# Patient Record
Sex: Female | Born: 1960 | Race: White | Hispanic: No | Smoking: Never smoker
Health system: Southern US, Community
[De-identification: ages and names within clinical notes are randomized; demographics above are authoritative.]

## PROBLEM LIST (undated history)

## (undated) DIAGNOSIS — Z860100 Personal history of colon polyps, unspecified: Secondary | ICD-10-CM

## (undated) DIAGNOSIS — K219 Gastro-esophageal reflux disease without esophagitis: Secondary | ICD-10-CM

## (undated) DIAGNOSIS — I10 Essential (primary) hypertension: Secondary | ICD-10-CM

## (undated) DIAGNOSIS — G8929 Other chronic pain: Secondary | ICD-10-CM

## (undated) DIAGNOSIS — R1012 Left upper quadrant pain: Secondary | ICD-10-CM

## (undated) HISTORY — DX: Other chronic pain: G89.29

## (undated) HISTORY — DX: Gastro-esophageal reflux disease without esophagitis: K21.9

## (undated) HISTORY — DX: Personal history of colonic polyps: Z86.010

## (undated) HISTORY — DX: Personal history of colon polyps, unspecified: Z86.0100

## (undated) HISTORY — PX: COLONOSCOPY: SHX174

## (undated) HISTORY — PX: APPENDECTOMY: SHX54

## (undated) HISTORY — DX: Essential (primary) hypertension: I10

## (undated) HISTORY — DX: Left upper quadrant pain: R10.12

---

## 1999-06-24 ENCOUNTER — Encounter: Payer: Self-pay | Admitting: Family Medicine

## 1999-06-24 ENCOUNTER — Encounter: Admission: RE | Admit: 1999-06-24 | Discharge: 1999-06-24 | Payer: Self-pay | Admitting: Family Medicine

## 1999-07-13 ENCOUNTER — Encounter: Payer: Self-pay | Admitting: Family Medicine

## 1999-07-13 ENCOUNTER — Encounter: Admission: RE | Admit: 1999-07-13 | Discharge: 1999-07-13 | Payer: Self-pay | Admitting: Family Medicine

## 2001-01-31 ENCOUNTER — Encounter: Payer: Self-pay | Admitting: Family Medicine

## 2001-01-31 ENCOUNTER — Encounter: Admission: RE | Admit: 2001-01-31 | Discharge: 2001-01-31 | Payer: Self-pay | Admitting: Family Medicine

## 2001-09-10 ENCOUNTER — Other Ambulatory Visit: Admission: RE | Admit: 2001-09-10 | Discharge: 2001-09-10 | Payer: Self-pay | Admitting: Obstetrics and Gynecology

## 2001-12-13 ENCOUNTER — Other Ambulatory Visit: Admission: RE | Admit: 2001-12-13 | Discharge: 2001-12-13 | Payer: Self-pay | Admitting: Obstetrics and Gynecology

## 2002-04-25 ENCOUNTER — Other Ambulatory Visit: Admission: RE | Admit: 2002-04-25 | Discharge: 2002-04-25 | Payer: Self-pay | Admitting: Obstetrics and Gynecology

## 2002-09-25 ENCOUNTER — Encounter: Admission: RE | Admit: 2002-09-25 | Discharge: 2002-09-25 | Payer: Self-pay | Admitting: Family Medicine

## 2002-09-25 ENCOUNTER — Encounter: Payer: Self-pay | Admitting: Family Medicine

## 2002-11-24 ENCOUNTER — Other Ambulatory Visit: Admission: RE | Admit: 2002-11-24 | Discharge: 2002-11-24 | Payer: Self-pay | Admitting: Obstetrics and Gynecology

## 2003-09-10 ENCOUNTER — Other Ambulatory Visit: Admission: RE | Admit: 2003-09-10 | Discharge: 2003-09-10 | Payer: Self-pay | Admitting: Obstetrics and Gynecology

## 2005-05-04 ENCOUNTER — Other Ambulatory Visit: Admission: RE | Admit: 2005-05-04 | Discharge: 2005-05-04 | Payer: Self-pay | Admitting: Obstetrics and Gynecology

## 2005-08-19 ENCOUNTER — Ambulatory Visit: Payer: Self-pay | Admitting: Infectious Diseases

## 2005-08-19 ENCOUNTER — Inpatient Hospital Stay (HOSPITAL_COMMUNITY): Admission: EM | Admit: 2005-08-19 | Discharge: 2005-08-24 | Payer: Self-pay | Admitting: Emergency Medicine

## 2005-09-05 ENCOUNTER — Encounter: Admission: RE | Admit: 2005-09-05 | Discharge: 2005-09-05 | Payer: Self-pay | Admitting: Family Medicine

## 2006-11-09 ENCOUNTER — Emergency Department (HOSPITAL_COMMUNITY): Admission: EM | Admit: 2006-11-09 | Discharge: 2006-11-09 | Payer: Self-pay | Admitting: Emergency Medicine

## 2007-02-27 ENCOUNTER — Encounter: Admission: RE | Admit: 2007-02-27 | Discharge: 2007-02-27 | Payer: Self-pay | Admitting: Family Medicine

## 2007-03-07 ENCOUNTER — Encounter: Admission: RE | Admit: 2007-03-07 | Discharge: 2007-03-07 | Payer: Self-pay | Admitting: Family Medicine

## 2007-08-16 ENCOUNTER — Ambulatory Visit: Payer: Self-pay | Admitting: Internal Medicine

## 2007-09-05 ENCOUNTER — Ambulatory Visit: Payer: Self-pay | Admitting: Internal Medicine

## 2007-09-05 ENCOUNTER — Encounter: Payer: Self-pay | Admitting: Internal Medicine

## 2007-09-05 HISTORY — PX: UPPER GASTROINTESTINAL ENDOSCOPY: SHX188

## 2007-12-04 ENCOUNTER — Other Ambulatory Visit: Admission: RE | Admit: 2007-12-04 | Discharge: 2007-12-04 | Payer: Self-pay | Admitting: Family Medicine

## 2008-09-30 IMAGING — CR DG RIBS 2V*L*
2 series · 2 of 2 positions shown · non-contrast
Comparison: None.

CLINICAL DATA: Fall with left rib pain.

[w ribs ap/pa lower left]
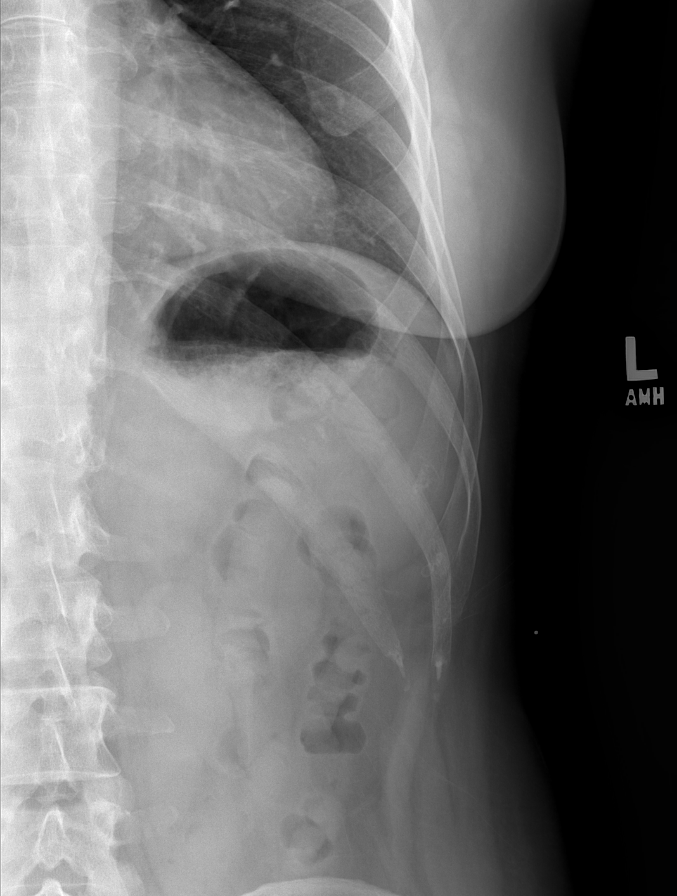

[w ribs oblique left]
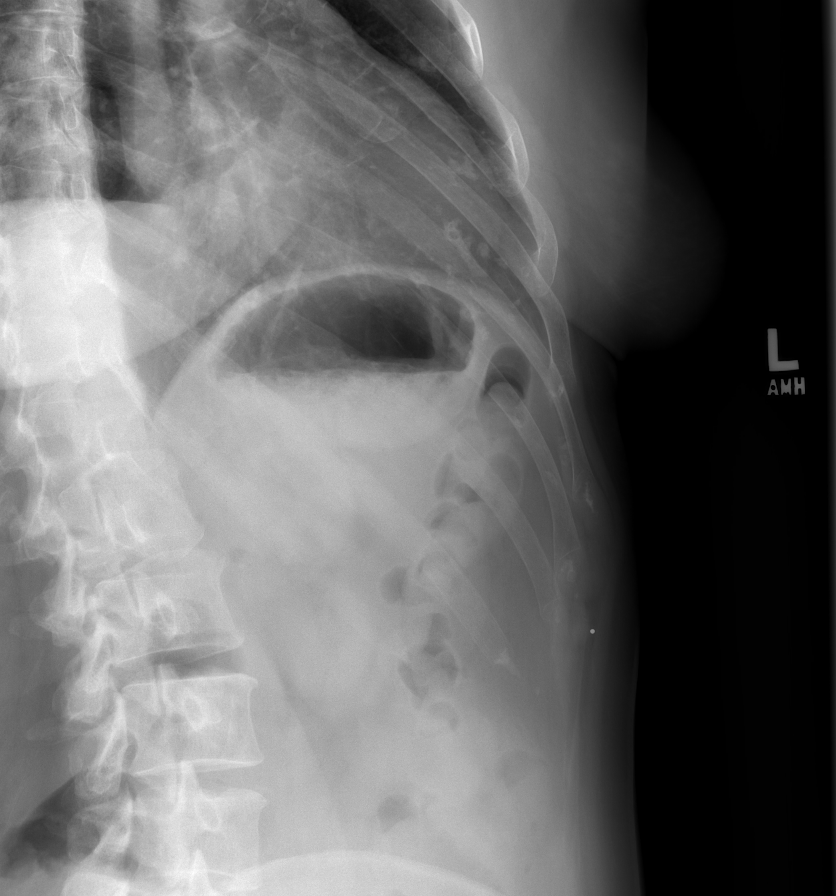

[2 of 2 positions shown; findings below may reference images not displayed]

This study was reviewed in the context of a two-view chest
x-ray performed at the same time.

LEFT RIBS - 2  VIEW:

A radiopaque BB localizes the region of the patient's concern. There is no
underlying lower left rib fracture. No evidence for left pleural effusion. There
is no left pneumothorax.
IMPRESSION: No evidence for left-sided rib fracture.

## 2008-09-30 IMAGING — CR DG CHEST 2V
2 series · 2 of 2 positions shown · non-contrast
Comparison: Chest x-ray from 11/09/2006

CLINICAL DATA: Right lateral rib pain and pain with breathing after a fall.

[w chest pa]
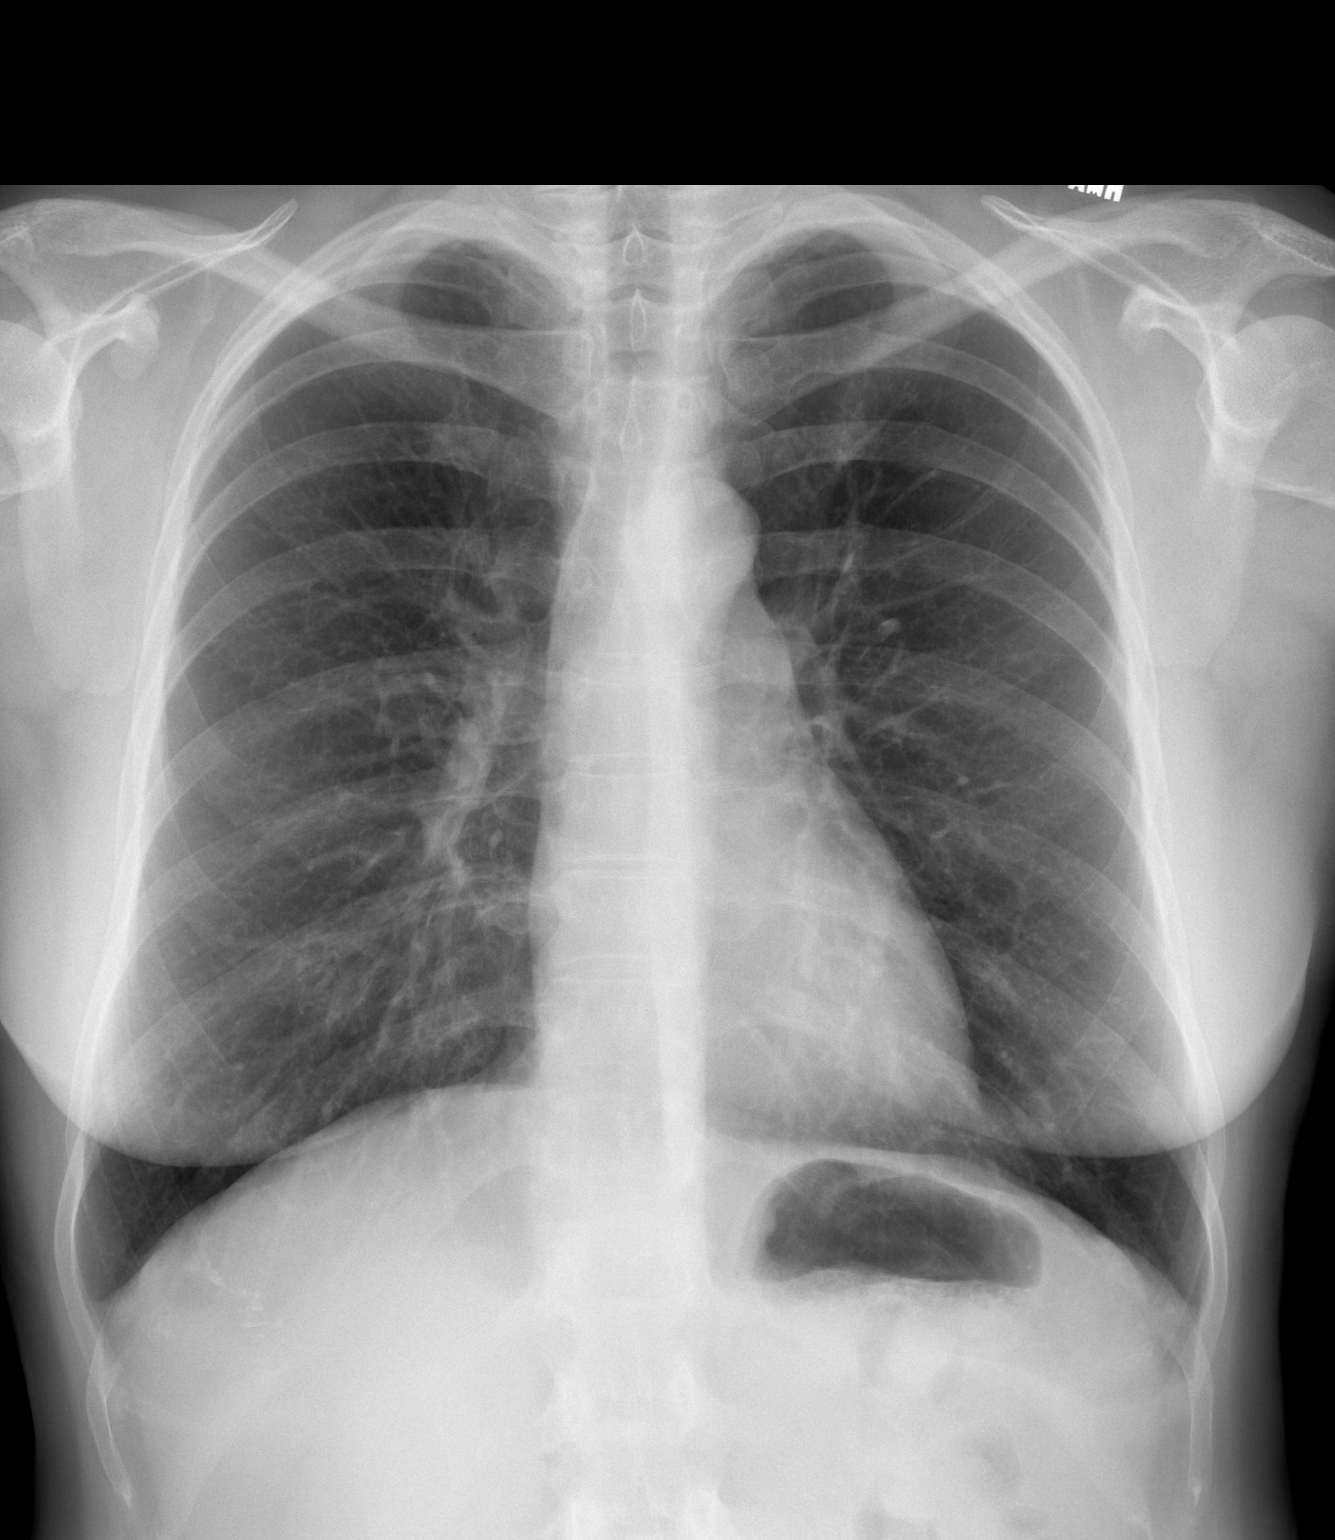

[w chest lat]
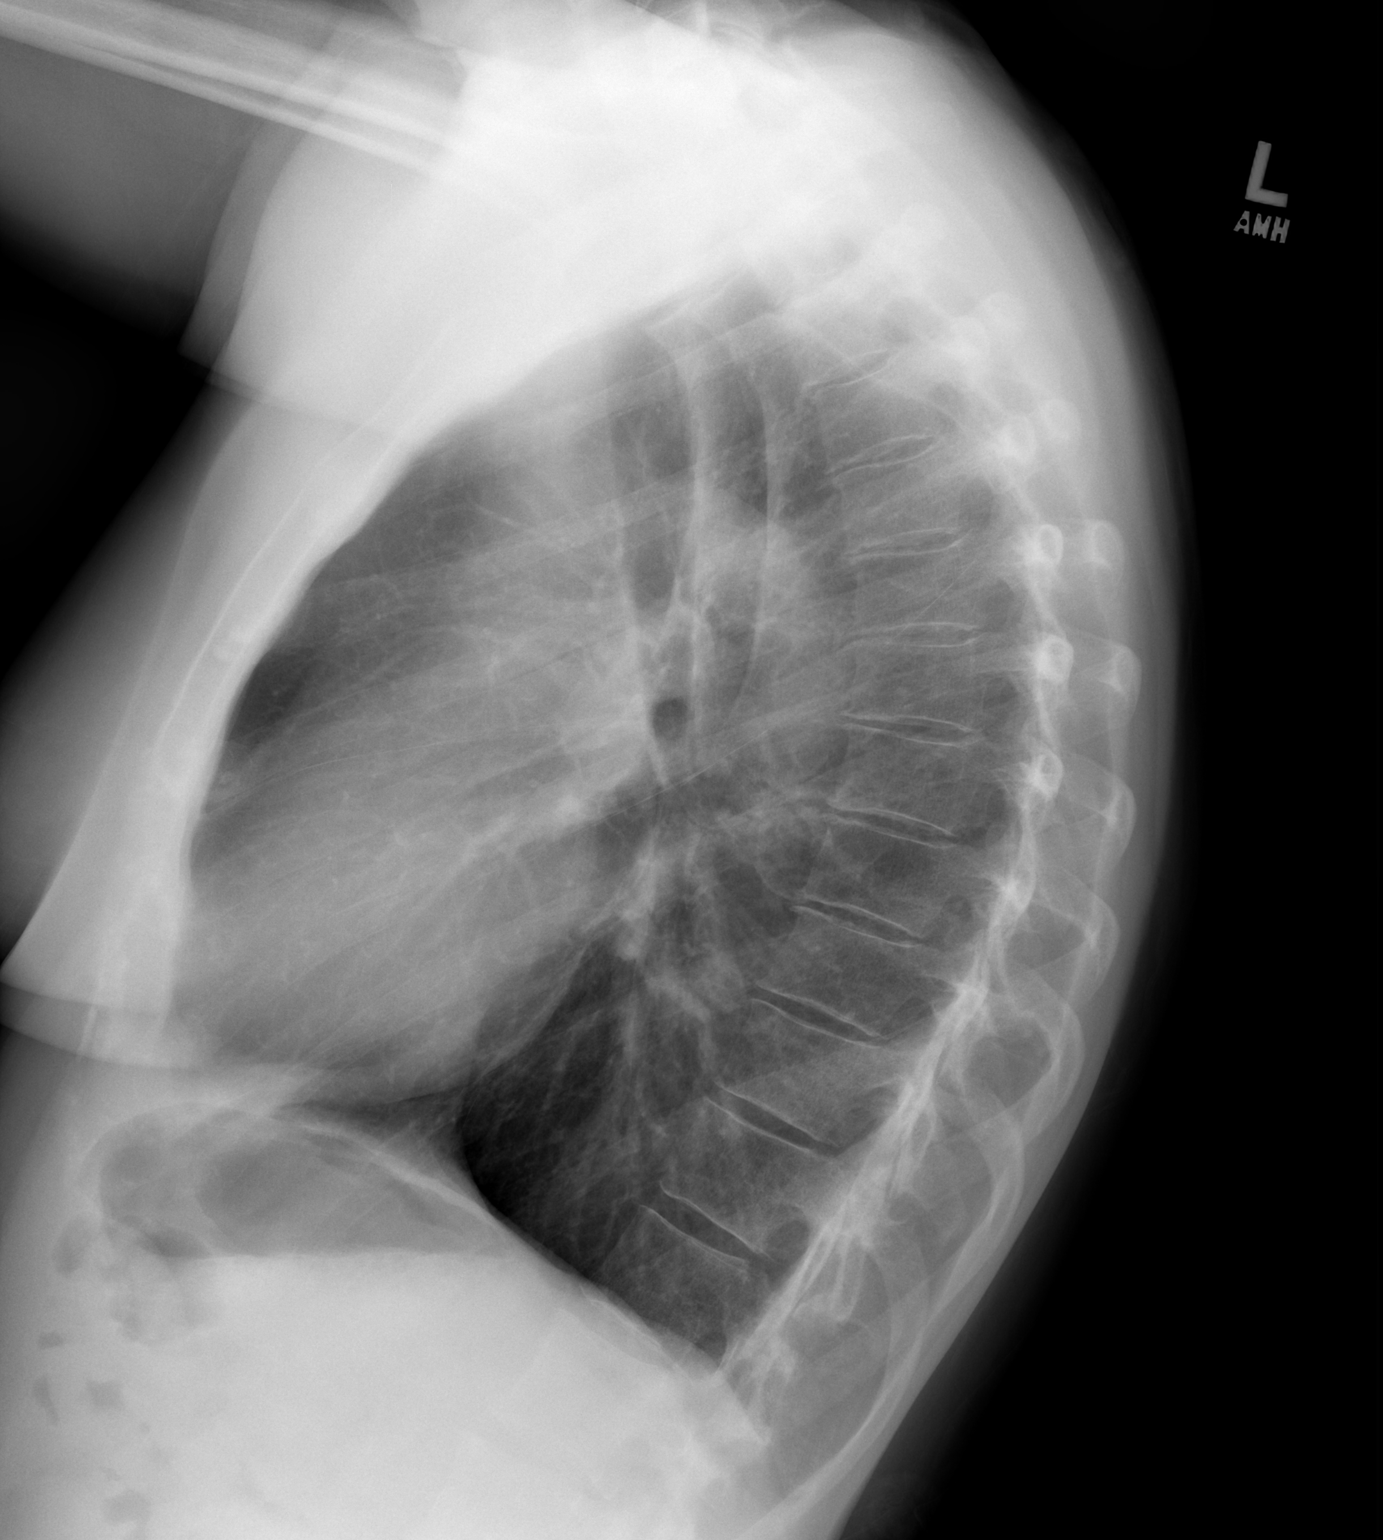

[2 of 2 positions shown; findings below may reference images not displayed]

CHEST - 2 VIEW:

The lungs are clear.  The cardiopericardial silhouette is within normal limits
for size.  Visualized bony structures of the thorax are intact.
IMPRESSION: No acute cardiopulmonary process

## 2010-08-26 ENCOUNTER — Encounter: Payer: Self-pay | Admitting: Internal Medicine

## 2010-09-22 NOTE — Letter (Signed)
Summary: Colonoscopy Letter  [facility]  [address]   [place], [place] [address]   Phone: [phone]  Fax: [phone]      August 26, 2010 MRN: [id number]   Heather Padilla [address] [place], [place]  [address]   Dear Heather Padilla,   According to your medical record, it is time for you to schedule a Colonoscopy. The American Cancer Society recommends this procedure as a method to detect early colon cancer. Patients with a family history of colon cancer, or a personal history of colon polyps or inflammatory bowel disease are at increased risk.  This letter has been generated based on the recommendations made at the time of your procedure. If you feel that in your particular situation this may no longer apply, please contact our office.  Please call our office at [phone] to schedule this appointment or to update your records at your earliest convenience.  Thank you for cooperating with [place] to provide you with the very best care possible.   Sincerely,   Stan Head, M.D.  [facility] [phone]

## 2011-01-03 NOTE — Assessment & Plan Note (Signed)
[facility]                         GASTROENTEROLOGY OFFICE NOTE   DYNASTIE, KNOOP          MRN:          [id number]  DATE:08/16/2007                            DOB:          1960-09-13    CHIEF COMPLAINT:  Nausea.  Heartburn.   HISTORY:  This is a 50 year old white woman who has had a couple month  history of nausea, some vomiting and regurgitation (more regurgitation)  and heartburn described as a pressure pain in her chest.  She also has  indigestion, which is more of a nausea and stomach upset feeling.  She  took a 2-week course of Prilosec, which helped quite a bit, especially  with nocturnal symptoms she was having.  There is also a background of  an intermittent left upper quadrant pain that is fairly sharp, which  comes and goes off and on for a number of years without any obvious  association.  She complains of loose bowel movements after a normal  bowel movement every day for the past 3 to 4 months.  She denies any  significant stressors or changes over that, which is normal.  However,  she recently resolved a dispute between a man she was in a business  with, and she was locked out, and he agreed to settle finally.  She  has lost her appetite as well, though she does not describe a weight  loss.  She is not reporting any bleeding with her bowel movement.  Her  GI review of systems is otherwise negative.   MEDICATIONS:  1. Lisinopril 20 mg daily.  2. Aspirin p.r.n.  3. Ambien p.r.n.  4. Mylanta p.r.n.   DRUG ALLERGIES:  SULFA causes a rash.   PAST MEDICAL HISTORY:  1. Hypertension.  2. Viral meningitis with chronic fatigue afterwards (2006).  3. Prior tubal ligation.  4. Prior Cesarean section.  5. Prior appendectomy.   SOCIAL HISTORY:  She is widowed twice, losing husbands in 1999 and 2004.  She lives with her son.  She has been a IT consultant.  She is currently  unemployed.  She has an associate's degree.  She  used to have a couple  of beers daily.  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  Almost no caffeine is used, because  that bothers her stomach.   REVIEW OF SYSTEMS:  See medical history form for full details.  She has  had a dry cough that has been related to the lisinopril.  She has had a  pityriasis rosea rash on the trunk, which is slowly getting better.  She  has had some itching associated with that.  She has had insomnia.  Eye  glasses or contact lenses are used.  She has had the fatigue.  Last  menstrual period August 01, 2007.  She had a benign skin lesion  removed by Dr. Dorinda Hill in the past year.   All other review of systems negative or as reflected in my medical  history form.   PHYSICAL EXAM:  Height 5 feet 3 inches, weight 130 pounds, blood  pressure 130/88, pulse 80.  HEENT:  Eyes anicteric.  ENT normal mouth and posterior pharynx.  NECK:  No thyromegaly or mass.  CHEST:  Clear.  HEART:  S1, S2.  No murmur, rub, or gallop.  ABDOMEN:  Soft, nontender without organomegaly or mass.  LYMPHATICS:  No neck or supraclavicular nodes.  LOWER EXTREMITIES:  Free of edema.  The skin on the lower extremities  sort of dry and scaly.  I cannot make out a classic tree trunk rash with  the pityriasis rosea on her trunk, but there is some diffuse scaliness  to the skin on the back.  NEURO:  She is alert and oriented x3.  PSYCH:  She appears slightly anxious to me and perhaps even slightly  diffusely tremulous, but mood is otherwise appropriate, as is affect.  RECTAL:  Deferred.   ASSESSMENT:  1. Heartburn and indigestion symptoms with some nausea and vomiting.      Somewhat better.  2. Chronic intermittent left upper quadrant pain.  3. Change in bowel habits, unclear etiology.   PLAN:  1. Sounds like it could very well be a functional syndrome.  Could      have reflux disease.  Given these signs and symptoms, I think an       upper GI endoscopy and a colonoscopy are appropriate to exclude      serious causes, such as ulcer disease, gastrointestinal malignancy,      et Karie Soda.  This has been scheduled for September 05, 2007.  Risks,      benefits, and indications are explained.  2. She has been instructed to reduce her alcohol intake.  3. We will request records from Dr. Manus Gunning regarding labs and office      visits within the last 6 months.  4. Further plans pending the above.  I appreciate the opportunity to      care for this patient.     Iva Boop, MD,FACG  Electronically Signed    CEG/MedQ  DD: 08/16/2007  DT: 08/16/2007  Job #: [id number]   cc:   Bryan Lemma. Manus Gunning, M.D.

## 2011-01-06 NOTE — Discharge Summary (Signed)
Heather Padilla, Heather Padilla   ACCOUNT NO.:  [id number]   MEDICAL RECORD NO.:  [id number]          PATIENT TYPE:  INP   LOCATION:  1614                         FACILITY:  [facility]   PHYSICIAN:  Hollice Espy, M.D.DATE OF BIRTH:  07-Oct-1960   DATE OF ADMISSION:  08/19/2005  DATE OF DISCHARGE:  08/24/2005                                 DISCHARGE SUMMARY   CONSULTATIONS:  Tinnie Gens C. Ninetta Lights, M.D.   PRIMARY CARE PHYSICIAN:  Bryan Lemma. Manus Gunning, M.D.   DISCHARGE DIAGNOSES:  1.  Viral meningitis.  2.  Migraine headache secondary to continued pain medications.  3.  Constipation.   DISCHARGE MEDICATIONS:  1.  Toradol 10 mg 1 p.o. q.6 h p.r.n. total #8.  2.  Percocet 5/325, 1 p.o. q.6 h, total #8.  3.  Ambien 5 mg 1 p.o. q.h.s. p.r.n.   HOSPITAL COURSE:  The patient is a 50 year old white female with essentially  no past medical history who presented on August 19, 2005 complaining of a  severe headache and photophobia. She had previously been treated for UTI  recently and then had been taking Cipro. She presented to the emergency room  with a heart rate of 119 and blood pressure of initially 161/98 and  temperature of 101. Meningitis was suspected and the patient after having a  normal CT scan had a spinal tap done which showed glucose 48, protein 51,  both of these values within normal limits. Tube 1 had 14-30 red cells and 35  white cells, tube 4 had 80 red cells and 33 white cells. Blood cells were  noted to be 100% lymphocyte. ER attending discussed the patient with Dr.  Sandria Manly in the neurology service who advised the patient be admitted for  observation as she may have a partially treated bacterial meningitis. The  patient was started on IV Rocephin. On hospital day 2, the patient was  feeling a little bit better. She started developing some neck stiffness over  the course but it had improved then after several hours with pain  medication. She was noted to have a white count  of 13 with 77% neutrophils.  However in review of lymphocyte predominant spinal tap, she was started on  prophylactic IV acyclovir, HSV titers were sent as well. The patient,  through the course over the next 2 days, had problems with continued pain  and neck stiffness. Her white count continued to improve and by August 22, 2005 was down to 10.0. However, it was noted that she still was spiking  temperatures as high as 101.7. I contacted infectious disease who evaluated  the patient on August 22, 2005. They felt that likely this was meningitis,  perhaps a benign lymphocytic meningitis; however, needed to rule out outside  causes such as herpes simplex or HIV requiring intervention. The patient had  HSV and HIV titers drawn and currently is doing better. The patient also was  complaining in terms of her headache continued pain despite improved neck  stiffness but continued pain with visual aura. I suspect that perhaps she  may be having a rebound migraine which may be clouding the picture secondary  to around the clock  narcotic use. I tapered back the patient's Dilaudid down  to less than q.8 h and added Toradol and Tylenol to this. By August 23, 2005, the patient was starting to feel better and by August 24, 2005, the  patient was feeling back to near her baseline. She said that the only time  she was having a headache was early in the morning but otherwise she had no  more headaches. She had no visual auras. She felt good and was wanting to be  discharged. At the time, her HSV titer was found to be negative as well as  CSF cultures. From an infectious disease standpoint, it was felt that she  could be discharged home though in terms of followup they recommend the  patient followup with her PCP, Dr. Manus Gunning, in the next 1-2 weeks so that  her HIV PCR which is still pending can be followed up on. The patient is  otherwise doing well.   DISPOSITION:  Improved.   ACTIVITY:  As  tolerated.   DIET:  Regular diet.   She is being discharged to home. She will followup with her PCP, Dr.  Manus Gunning, in the next 2 weeks as a followup as well as check HIV titers.      Hollice Espy, M.D.  Electronically Signed     SKK/MEDQ  D:  08/24/2005  T:  08/24/2005  Job:  [id number]   cc:   Lacretia Leigh. Ninetta Lights, M.D.  Fax: [phone]   Bryan Lemma. Manus Gunning, M.D.  Fax: [phone]

## 2011-01-06 NOTE — H&P (Signed)
NAMELYRIC, ROSSANO   ACCOUNT NO.:  [id number]   MEDICAL RECORD NO.:  [id number]          PATIENT TYPE:  EMS   LOCATION:  ED                           FACILITY:  [facility]   PHYSICIAN:  Sherin Quarry, MD      DATE OF BIRTH:  11-24-1960   DATE OF ADMISSION:  08/19/2005  DATE OF DISCHARGE:                                HISTORY & PHYSICAL   Heather Padilla is a 50 year old lady who is generally in good  health. She is a patient of Dr. Jeanmarie Hubert. The patient states that about  10 days prior to this presentation, she presented Dr. Randel Books office. She  says at that time she had a 2 or 3-day history of fever associated with  right-sided abdominal pain. She was evaluated and found to have evidence of  a urinary tract infection for which she was placed on Cipro and instructed  to take this medication on a twice-daily basis for 10 days. She finished the  course of Cipro yesterday. Over the last several days, she has had  persistent dull throbbing headache which seems to be getting more severe.  She has also had persistent fever with temperature to 100-101 degrees. She  says that light bothers her eyes. There is been no rash. She says her neck  is not stiff. She has had no breathing difficulty or chest pain, no nausea,  vomiting or abdominal pain. She says that her bowels have been somewhat  constipated. She has not noticed any dysuria, urinary frequency or nocturia.  She presented to [facility] Room with these complaints. On  presentation to the emergency room, her temperature was 101; blood pressure  was initially 161/98; heart rate was 119, respirations 20; O2 saturation was  98%. The patient had laboratory studies which included a white count of  13,500. A urinalysis was negative. Sodium is 131, potassium 3.4, glucose 92,  creatinine 1.2, BUN 8. Liver profile was normal. Influenza A and B nasal  washings were negative. Dr. Estell Harpin performed a spinal fluid  examination  after doing a CT scan of the brain which was negative. Spinal fluid exam  showed a glucose of 48, protein of 51. Both of these values were within  normal limits. Tube #1 had 14 to 30 red cells and 35 white cells. Tube #4  had 80 red cells and 33 white cells. White cells were also 100% lymphocytes.  Dr. Estell Harpin discussed the case with Dr. Sandria Manly, of the neurology service, who  advised him that the patient should be admitted for observation because of  the possibility that she might have a partially treated bacterial  meningitis. He suggested that a lumbar puncture be repeated in 24 hours.  Therefore, we are admitting Heather Padilla for further evaluation and  observation.   PAST MEDICAL HISTORY:  Medications are none.   ALLERGIES:  She is allergic to SULFA DRUGS.   OPERATION:  She had an appendectomy at age 4 and also had a C-section for  delivery of her children.   MEDICAL ILLNESSES:  None.   FAMILY HISTORY:  Is significant in that her mother and father both died of  complications of  lung cancer. Her brothers and sisters are in good health.   SOCIAL HISTORY:  She reports that she occasionally drinks alcohol. She does  not smoke. She has occasionally smoked marijuana in the past.   PHYSICAL EXAMINATION:  HEENT: Exam is within normal limits. The pupils were  equal and reactive. She has very mild photophobia.  NECK: Very supple. There is no meningismus.  CHEST: Chest is clear.  BACK: Examination the back reveals no CVA or point tenderness.  CARDIOVASCULAR:  Reveals normal sinus rhythm and is without gross murmurs or  gallops.  ABDOMEN: The abdomen is benign with normal bowel sounds without masses or  tenderness.  NEUROLOGIC: On neurologic testing, cranial nerves, motor, sensory and  cerebellar testing is normal. The patient is alert and oriented x3. She is  able to answer detailed questions.  EXTREMITIES: Examination extremities reveals no cyanosis or edema.   SKIN: There is no evidence of any rash.   LABORATORY STUDIES:  As previously noted, included white count of 13,500,  hemoglobin 12.5. The differential shows an increased percentage of  granulocytes. Platelet counts is 54,000. Urinalysis is negative. Sodium 131,  potassium 3.4, glucose 92, creatinine 1.2, BUN 8. Liver profile is normal.  Influenza serologies were negative. Spinal fluid as described above.   IMPRESSION:  1.  Probable viral meningitis. It is possible the patient could have a      partially treated bacterial meningitis, although this seems to be less      likely on the basis of the spinal fluid results.  2.  History of urinary tract infection.   PLAN:  The patient will be given intravenous fluids. We will administer pain  medications as needed. It is somewhat problematic to decide how best to  proceed with her antibiotic therapy.  Appropriately, Dr. Estell Harpin gave her  Rocephin IV before doing the spinal fluid examination. Therefore, since we  are contemplating the possibility that she might have a partially treated  bacterial meningitis, I think it would be best to continue the intravenous  antibiotics. The benefits of this approach would appear to outweigh the  risks. A follow-up spinal fluid examination in 24 hours might be useful.           ______________________________  Sherin Quarry, MD     SY/MEDQ  D:  08/19/2005  T:  08/20/2005  Job:  [id number]   cc:   Bryan Lemma. Manus Gunning, M.D.  Fax: [phone]

## 2011-02-18 ENCOUNTER — Inpatient Hospital Stay (INDEPENDENT_AMBULATORY_CARE_PROVIDER_SITE_OTHER)
Admission: RE | Admit: 2011-02-18 | Discharge: 2011-02-18 | Disposition: A | Discharge status: Home / Self Care | Payer: Self-pay | Source: Ambulatory Visit | Attending: Family Medicine | Admitting: Family Medicine

## 2011-02-18 DIAGNOSIS — I10 Essential (primary) hypertension: Secondary | ICD-10-CM

## 2011-02-18 DIAGNOSIS — T148XXA Other injury of unspecified body region, initial encounter: Secondary | ICD-10-CM

## 2011-03-09 ENCOUNTER — Telehealth: Payer: Self-pay | Admitting: Internal Medicine

## 2011-03-10 ENCOUNTER — Other Ambulatory Visit: Payer: Self-pay | Admitting: *Deleted

## 2011-03-10 ENCOUNTER — Encounter: Payer: Self-pay | Admitting: Internal Medicine

## 2011-03-10 DIAGNOSIS — T8852XA Failed moderate sedation during procedure, initial encounter: Secondary | ICD-10-CM

## 2011-03-10 DIAGNOSIS — Z9283 Personal history of failed moderate sedation: Secondary | ICD-10-CM | POA: Insufficient documentation

## 2011-03-10 NOTE — Telephone Encounter (Signed)
Yes - propofol I added personal hx failed moderate sedation to problem list

## 2011-03-10 NOTE — Telephone Encounter (Signed)
Spoke with patient and scheduled her for colonoscopy with propofol on 03/28/11 arrival at 1:00 for 2:00 PM procedure. Pre visit on 03/21/11 at 3:30 PM.

## 2011-03-10 NOTE — Telephone Encounter (Signed)
Per colonoscopy report on 09/05/2007-consider deep sedation next exam. Dr. Leone Payor, do you want propofol for this patient?

## 2011-03-10 NOTE — Telephone Encounter (Signed)
Left a message for patient to call me back.

## 2011-03-21 ENCOUNTER — Ambulatory Visit (AMBULATORY_SURGERY_CENTER): Payer: BC Managed Care – PPO | Admitting: *Deleted

## 2011-03-21 VITALS — Ht 63.0 in | Wt 131.0 lb

## 2011-03-21 DIAGNOSIS — Z1211 Encounter for screening for malignant neoplasm of colon: Secondary | ICD-10-CM

## 2011-03-21 MED ORDER — PEG-KCL-NACL-NASULF-NA ASC-C 100 G PO SOLR: ORAL | Status: DC

## 2011-03-22 ENCOUNTER — Encounter: Payer: Self-pay | Admitting: Internal Medicine

## 2011-03-28 ENCOUNTER — Encounter: Payer: Self-pay | Admitting: Internal Medicine

## 2011-03-28 ENCOUNTER — Ambulatory Visit (AMBULATORY_SURGERY_CENTER): Payer: BC Managed Care – PPO | Admitting: Internal Medicine

## 2011-03-28 VITALS — BP 164/97 | HR 95 | Temp 98.9°F | Resp 14 | Ht 63.0 in | Wt 131.0 lb

## 2011-03-28 DIAGNOSIS — Z860100 Personal history of colon polyps, unspecified: Secondary | ICD-10-CM | POA: Insufficient documentation

## 2011-03-28 DIAGNOSIS — I10 Essential (primary) hypertension: Secondary | ICD-10-CM | POA: Insufficient documentation

## 2011-03-28 DIAGNOSIS — Z1211 Encounter for screening for malignant neoplasm of colon: Secondary | ICD-10-CM

## 2011-03-28 DIAGNOSIS — K219 Gastro-esophageal reflux disease without esophagitis: Secondary | ICD-10-CM | POA: Insufficient documentation

## 2011-03-28 MED ORDER — SODIUM CHLORIDE 0.9 % IV SOLN: 500.0000 mL | INTRAVENOUS | Status: DC

## 2011-03-29 ENCOUNTER — Telehealth: Payer: Self-pay | Admitting: *Deleted

## 2011-03-29 NOTE — Telephone Encounter (Signed)

## 2011-12-29 ENCOUNTER — Other Ambulatory Visit: Payer: Self-pay | Admitting: Dermatology

## 2012-07-01 ENCOUNTER — Emergency Department (HOSPITAL_COMMUNITY): Payer: BC Managed Care – PPO

## 2012-07-01 ENCOUNTER — Emergency Department (HOSPITAL_COMMUNITY)
Admission: EM | Admit: 2012-07-01 | Discharge: 2012-07-01 | Disposition: A | Discharge status: Home / Self Care | Payer: BC Managed Care – PPO | Attending: Emergency Medicine | Admitting: Emergency Medicine

## 2012-07-01 ENCOUNTER — Encounter (HOSPITAL_COMMUNITY): Payer: Self-pay

## 2012-07-01 DIAGNOSIS — I10 Essential (primary) hypertension: Secondary | ICD-10-CM | POA: Insufficient documentation

## 2012-07-01 DIAGNOSIS — S42309A Unspecified fracture of shaft of humerus, unspecified arm, initial encounter for closed fracture: Secondary | ICD-10-CM | POA: Insufficient documentation

## 2012-07-01 DIAGNOSIS — Z79899 Other long term (current) drug therapy: Secondary | ICD-10-CM | POA: Insufficient documentation

## 2012-07-01 DIAGNOSIS — S42209A Unspecified fracture of upper end of unspecified humerus, initial encounter for closed fracture: Secondary | ICD-10-CM | POA: Insufficient documentation

## 2012-07-01 DIAGNOSIS — Y929 Unspecified place or not applicable: Secondary | ICD-10-CM | POA: Insufficient documentation

## 2012-07-01 DIAGNOSIS — Y9301 Activity, walking, marching and hiking: Secondary | ICD-10-CM | POA: Insufficient documentation

## 2012-07-01 DIAGNOSIS — Z860100 Personal history of colon polyps, unspecified: Secondary | ICD-10-CM | POA: Insufficient documentation

## 2012-07-01 DIAGNOSIS — Z87891 Personal history of nicotine dependence: Secondary | ICD-10-CM | POA: Insufficient documentation

## 2012-07-01 DIAGNOSIS — R1012 Left upper quadrant pain: Secondary | ICD-10-CM | POA: Insufficient documentation

## 2012-07-01 DIAGNOSIS — K219 Gastro-esophageal reflux disease without esophagitis: Secondary | ICD-10-CM | POA: Insufficient documentation

## 2012-07-01 DIAGNOSIS — W1789XA Other fall from one level to another, initial encounter: Secondary | ICD-10-CM | POA: Insufficient documentation

## 2012-07-01 MED ORDER — OXYCODONE-ACETAMINOPHEN 5-325 MG PO TABS
1.0000 | ORAL_TABLET | Freq: Once | ORAL | Status: AC
  Administered 2012-07-01: 1 via ORAL
  Filled 2012-07-01: qty 1

## 2012-07-01 MED ORDER — OXYCODONE-ACETAMINOPHEN 5-325 MG PO TABS: 1.0000 | ORAL_TABLET | Freq: Four times a day (QID) | ORAL | Status: DC | PRN

## 2012-07-01 NOTE — ED Provider Notes (Signed)
Medical screening examination/treatment/procedure(s) were performed by non-physician practitioner and as supervising physician I was immediately available for consultation/collaboration.  Sunnie Nielsen, MD 07/01/12 [phone]

## 2012-07-01 NOTE — ED Provider Notes (Signed)
History     CSN: [id number]  Arrival date & time 07/01/12  0016   First MD Initiated Contact with Patient 07/01/12 0153      Chief Complaint  Patient presents with  . Fall  . Arm Pain    (Consider location/radiation/quality/duration/timing/severity/associated sxs/prior treatment) HPI Comments: Patient fell while trying to get into bed landing on L shoulder/arm now with deformity in L upper extremity.   States her blood pressure medication have been changed and she may have been slightly dizzy form that but is unsure Denies any other injury   Patient is a 51 y.o. female presenting with fall and arm pain. The history is provided by the patient.  Fall The accident occurred 1 to 2 hours ago. The fall occurred while walking. She fell from a height of 3 to 5 ft. The point of impact was the left shoulder. The pain is at a severity of 10/10. The pain is moderate. There was no entrapment after the fall. There was no alcohol use involved in the accident. Pertinent negatives include no fever and no numbness. The symptoms are aggravated by activity.  Arm Pain Pertinent negatives include no chills, fever, joint swelling, neck pain, numbness or weakness.    Past Medical History  Diagnosis Date  . GERD (gastroesophageal reflux disease)   . Hypertension   . Personal history of colonic polyps     2 cm tubulovillous adenoma  . Chronic LUQ pain     intermittent, ? splenic flexure syndrome    Past Surgical History  Procedure Date  . Colonoscopy 09/05/2007; 03/28/2011    2009: 2 cm Tubulovillous adenoma; 2012 Normal  . Cesarean section 1987  . Appendectomy   . Upper gastrointestinal endoscopy 09/05/2007    GERD    No family history on file.  History  Substance Use Topics  . Smoking status: Former Smoker    Quit date: 12/26/1999  . Smokeless tobacco: Never Used  . Alcohol Use: 3.6 oz/week    6 Cans of beer per week    OB History    Grav Para Term Preterm Abortions TAB SAB Ect Mult  Living                  Review of Systems  Constitutional: Negative for fever and chills.  HENT: Negative for neck pain.   Respiratory: Negative.   Cardiovascular: Negative.   Gastrointestinal: Negative.   Genitourinary: Negative.   Musculoskeletal: Negative for back pain and joint swelling.  Neurological: Negative for dizziness, weakness and numbness.    Allergies  Sulfa antibiotics  Home Medications   Current Outpatient Rx  Name  Route  Sig  Dispense  Refill  . ATENOLOL 25 MG PO TABS   Oral   Take 25 mg by mouth daily.         . BUSPIRONE HCL 10 MG PO TABS   Oral   Take 5 mg by mouth 2 (two) times daily.         Marland Kitchen LANSOPRAZOLE 30 MG PO CPDR   Oral   Take 30 mg by mouth daily.           Marland Kitchen LISINOPRIL 40 MG PO TABS   Oral   Take 40 mg by mouth daily.           . OXYCODONE-ACETAMINOPHEN 5-325 MG PO TABS   Oral   Take 1 tablet by mouth every 6 (six) hours as needed for pain.   30 tablet   0  BP 142/101  Pulse 99  Temp 98 F (36.7 C) (Oral)  Resp 16  Ht 5\' 3"  (1.6 m)  Wt 130 lb (58.968 kg)  BMI 23.03 kg/m2  SpO2 100%  Physical Exam  Constitutional: She is oriented to person, place, and time. She appears well-developed and well-nourished.  HENT:  Head: Normocephalic.  Eyes: Pupils are equal, round, and reactive to light.  Neck: Normal range of motion.  Cardiovascular: Normal rate.   Pulmonary/Chest: Effort normal.  Abdominal: Soft. There is no tenderness.  Musculoskeletal: She exhibits tenderness.       Arms: Neurological: She is alert and oriented to person, place, and time.  Skin: Skin is warm and dry. No erythema.    ED Course  Procedures (including critical care time)  Labs Reviewed - No data to display Dg Humerus Left  07/01/2012  *RADIOLOGY REPORT*  Clinical Data: Status post fall  LEFT HUMERUS - 2+ VIEW  Comparison: None.  Findings: There is a comminuted and oblique fracture deformity involving the proximal and mid shaft  humerus. There is medial angulation of the distal fracture fragments.  There is medial displacement of the distal fracture fragments by one full shaft's width.  IMPRESSION:  1.  Comminuted fracture involves the proximal and mid shaft of the left humerus.  There is medial angulation and displacement of the distal fracture fragments.   Original Report Authenticated By: Signa Kell, M.D.      1. Closed fracture of upper arm       MDM  Spoke with DR. Blackmon who agrees with plan of sling, pain control and office follow up         Arman Filter, NP 07/01/12 [phone]

## 2012-07-01 NOTE — ED Notes (Signed)
Pt presents with NAD- attempting to get in bed and fell landing on left side.  Deformity present to the LUE radial pulse present and skin warm to touch. denies neck and back pain

## 2012-07-02 ENCOUNTER — Encounter (HOSPITAL_COMMUNITY): Payer: Self-pay | Admitting: *Deleted

## 2012-07-02 ENCOUNTER — Ambulatory Visit (HOSPITAL_COMMUNITY): Payer: BC Managed Care – PPO

## 2012-07-02 ENCOUNTER — Ambulatory Visit (HOSPITAL_COMMUNITY): Payer: BC Managed Care – PPO | Admitting: *Deleted

## 2012-07-02 ENCOUNTER — Encounter (HOSPITAL_COMMUNITY)
Admission: RE | Disposition: A | Discharge status: Home / Self Care | Payer: Self-pay | Source: Ambulatory Visit | Attending: Orthopaedic Surgery

## 2012-07-02 ENCOUNTER — Other Ambulatory Visit (HOSPITAL_COMMUNITY): Payer: Self-pay | Admitting: Orthopaedic Surgery

## 2012-07-02 ENCOUNTER — Ambulatory Visit (HOSPITAL_COMMUNITY)
Admission: RE | Admit: 2012-07-02 | Discharge: 2012-07-03 | Disposition: A | Discharge status: Home / Self Care | Payer: BC Managed Care – PPO | Source: Ambulatory Visit | Attending: Orthopaedic Surgery | Admitting: Orthopaedic Surgery

## 2012-07-02 ENCOUNTER — Encounter (HOSPITAL_COMMUNITY): Payer: Self-pay | Admitting: Pharmacy Technician

## 2012-07-02 DIAGNOSIS — Y92009 Unspecified place in unspecified non-institutional (private) residence as the place of occurrence of the external cause: Secondary | ICD-10-CM | POA: Insufficient documentation

## 2012-07-02 DIAGNOSIS — I10 Essential (primary) hypertension: Secondary | ICD-10-CM | POA: Insufficient documentation

## 2012-07-02 DIAGNOSIS — K219 Gastro-esophageal reflux disease without esophagitis: Secondary | ICD-10-CM | POA: Insufficient documentation

## 2012-07-02 DIAGNOSIS — F341 Dysthymic disorder: Secondary | ICD-10-CM | POA: Insufficient documentation

## 2012-07-02 DIAGNOSIS — S42302A Unspecified fracture of shaft of humerus, left arm, initial encounter for closed fracture: Secondary | ICD-10-CM

## 2012-07-02 DIAGNOSIS — S42309A Unspecified fracture of shaft of humerus, unspecified arm, initial encounter for closed fracture: Secondary | ICD-10-CM | POA: Insufficient documentation

## 2012-07-02 DIAGNOSIS — W19XXXA Unspecified fall, initial encounter: Secondary | ICD-10-CM | POA: Insufficient documentation

## 2012-07-02 HISTORY — PX: ORIF HUMERUS FRACTURE: SHX2126

## 2012-07-02 LAB — BASIC METABOLIC PANEL
Calcium: 9.9 mg/dL (ref 8.4–10.5)
GFR calc Af Amer: 88 mL/min — ABNORMAL LOW (ref >90)
GFR calc non Af Amer: 76 mL/min — ABNORMAL LOW (ref >90)
Potassium: 4.1 mEq/L (ref 3.5–5.1)
Sodium: 131 mEq/L — ABNORMAL LOW (ref 135–145)

## 2012-07-02 LAB — CBC
Hemoglobin: 11.4 g/dL — ABNORMAL LOW (ref 12.0–15.0)
Platelets: 192 10*3/uL (ref 150–400)
RBC: 3.46 MIL/uL — ABNORMAL LOW (ref 3.87–5.11)
WBC: 10.5 10*3/uL (ref 4.0–10.5)

## 2012-07-02 LAB — SURGICAL PCR SCREEN: Staphylococcus aureus: POSITIVE — AB

## 2012-07-02 SURGERY — OPEN REDUCTION INTERNAL FIXATION (ORIF) HUMERAL SHAFT FRACTURE
Anesthesia: Regional | Site: Arm Upper | Laterality: Left | Wound class: Clean

## 2012-07-02 MED ORDER — OXYCODONE HCL 5 MG PO TABS
5.0000 mg | ORAL_TABLET | ORAL | Status: DC | PRN
  Administered 2012-07-03: 10 mg via ORAL
  Filled 2012-07-02: qty 2

## 2012-07-02 MED ORDER — METHOCARBAMOL 100 MG/ML IJ SOLN: 500.0000 mg | Freq: Four times a day (QID) | INTRAVENOUS | Status: DC | PRN

## 2012-07-02 MED ORDER — METOCLOPRAMIDE HCL 10 MG PO TABS: 5.0000 mg | ORAL_TABLET | Freq: Three times a day (TID) | ORAL | Status: DC | PRN

## 2012-07-02 MED ORDER — LACTATED RINGERS IV SOLN
INTRAVENOUS | Status: DC | PRN
  Administered 2012-07-02 (×3): via INTRAVENOUS

## 2012-07-02 MED ORDER — PROPOFOL 10 MG/ML IV BOLUS
INTRAVENOUS | Status: DC | PRN
  Administered 2012-07-02: 200 mg via INTRAVENOUS

## 2012-07-02 MED ORDER — ATENOLOL 25 MG PO TABS
25.0000 mg | ORAL_TABLET | Freq: Every day | ORAL | Status: DC
  Filled 2012-07-02: qty 1

## 2012-07-02 MED ORDER — HYDROCODONE-ACETAMINOPHEN 5-325 MG PO TABS
1.0000 | ORAL_TABLET | ORAL | Status: DC | PRN
  Administered 2012-07-03: 2 via ORAL
  Filled 2012-07-02 (×2): qty 2

## 2012-07-02 MED ORDER — MIDAZOLAM HCL 2 MG/2ML IJ SOLN
INTRAMUSCULAR | Status: AC
  Filled 2012-07-02: qty 2

## 2012-07-02 MED ORDER — ONDANSETRON HCL 4 MG/2ML IJ SOLN
INTRAMUSCULAR | Status: DC | PRN
  Administered 2012-07-02: 4 mg via INTRAVENOUS

## 2012-07-02 MED ORDER — MIDAZOLAM HCL 5 MG/5ML IJ SOLN
INTRAMUSCULAR | Status: DC | PRN
  Administered 2012-07-02 (×2): 2 mg via INTRAVENOUS

## 2012-07-02 MED ORDER — CEFAZOLIN SODIUM-DEXTROSE 2-3 GM-% IV SOLR
INTRAVENOUS | Status: AC
  Filled 2012-07-02: qty 50

## 2012-07-02 MED ORDER — LISINOPRIL 40 MG PO TABS
40.0000 mg | ORAL_TABLET | Freq: Every day | ORAL | Status: DC
  Filled 2012-07-02: qty 1

## 2012-07-02 MED ORDER — BUPIVACAINE-EPINEPHRINE PF 0.5-1:200000 % IJ SOLN
INTRAMUSCULAR | Status: DC | PRN
  Administered 2012-07-02: 30 mL

## 2012-07-02 MED ORDER — DIPHENHYDRAMINE HCL 12.5 MG/5ML PO ELIX: 12.5000 mg | ORAL_SOLUTION | ORAL | Status: DC | PRN

## 2012-07-02 MED ORDER — OXYCODONE HCL 5 MG PO TABS: 5.0000 mg | ORAL_TABLET | Freq: Once | ORAL | Status: DC | PRN

## 2012-07-02 MED ORDER — METOCLOPRAMIDE HCL 5 MG/ML IJ SOLN: 5.0000 mg | Freq: Three times a day (TID) | INTRAMUSCULAR | Status: DC | PRN

## 2012-07-02 MED ORDER — HYDROMORPHONE HCL PF 1 MG/ML IJ SOLN
0.2500 mg | INTRAMUSCULAR | Status: DC | PRN
Start: 2012-07-02 — End: 2012-07-02

## 2012-07-02 MED ORDER — PHENYLEPHRINE HCL 10 MG/ML IJ SOLN
INTRAMUSCULAR | Status: DC | PRN
  Administered 2012-07-02 (×4): 80 ug via INTRAVENOUS

## 2012-07-02 MED ORDER — MIDAZOLAM HCL 2 MG/2ML IJ SOLN
0.5000 mg | INTRAMUSCULAR | Status: DC | PRN
  Administered 2012-07-02: 2 mg via INTRAVENOUS

## 2012-07-02 MED ORDER — PANTOPRAZOLE SODIUM 40 MG PO TBEC
40.0000 mg | DELAYED_RELEASE_TABLET | Freq: Every day | ORAL | Status: DC
  Administered 2012-07-03: 40 mg via ORAL
  Filled 2012-07-02: qty 1

## 2012-07-02 MED ORDER — ONDANSETRON HCL 4 MG/2ML IJ SOLN: 4.0000 mg | Freq: Four times a day (QID) | INTRAMUSCULAR | Status: DC | PRN

## 2012-07-02 MED ORDER — FENTANYL CITRATE 0.05 MG/ML IJ SOLN
INTRAMUSCULAR | Status: DC | PRN
  Administered 2012-07-02: 100 ug via INTRAVENOUS

## 2012-07-02 MED ORDER — FENTANYL CITRATE 0.05 MG/ML IJ SOLN
50.0000 ug | INTRAMUSCULAR | Status: DC | PRN
  Administered 2012-07-02: 100 ug via INTRAVENOUS

## 2012-07-02 MED ORDER — ONDANSETRON HCL 4 MG PO TABS
4.0000 mg | ORAL_TABLET | Freq: Four times a day (QID) | ORAL | Status: DC | PRN
  Administered 2012-07-03: 4 mg via ORAL
  Filled 2012-07-02: qty 1

## 2012-07-02 MED ORDER — ZOLPIDEM TARTRATE 5 MG PO TABS: 5.0000 mg | ORAL_TABLET | Freq: Every evening | ORAL | Status: DC | PRN

## 2012-07-02 MED ORDER — CEFAZOLIN SODIUM 1-5 GM-% IV SOLN
1.0000 g | Freq: Four times a day (QID) | INTRAVENOUS | Status: DC
  Administered 2012-07-03 (×2): 1 g via INTRAVENOUS
  Filled 2012-07-02 (×3): qty 50

## 2012-07-02 MED ORDER — EPHEDRINE SULFATE 50 MG/ML IJ SOLN
INTRAMUSCULAR | Status: DC | PRN
  Administered 2012-07-02: 5 mg via INTRAVENOUS
  Administered 2012-07-02: 10 mg via INTRAVENOUS

## 2012-07-02 MED ORDER — ALPRAZOLAM 0.5 MG PO TABS
0.5000 mg | ORAL_TABLET | Freq: Two times a day (BID) | ORAL | Status: DC | PRN
  Administered 2012-07-03: 0.5 mg via ORAL
  Filled 2012-07-02: qty 1

## 2012-07-02 MED ORDER — METHOCARBAMOL 500 MG PO TABS
500.0000 mg | ORAL_TABLET | Freq: Four times a day (QID) | ORAL | Status: DC | PRN
  Administered 2012-07-03: 500 mg via ORAL
  Filled 2012-07-02: qty 1

## 2012-07-02 MED ORDER — SODIUM CHLORIDE 0.9 % IV SOLN
INTRAVENOUS | Status: DC
  Administered 2012-07-03: 03:00:00 via INTRAVENOUS

## 2012-07-02 MED ORDER — FENTANYL CITRATE 0.05 MG/ML IJ SOLN
INTRAMUSCULAR | Status: AC
  Filled 2012-07-02: qty 2

## 2012-07-02 MED ORDER — MUPIROCIN 2 % EX OINT
TOPICAL_OINTMENT | Freq: Two times a day (BID) | CUTANEOUS | Status: DC
  Administered 2012-07-02: 1 via NASAL
  Administered 2012-07-03 (×2): via NASAL
  Filled 2012-07-02 (×2): qty 22

## 2012-07-02 MED ORDER — HYDROMORPHONE HCL PF 1 MG/ML IJ SOLN
1.0000 mg | INTRAMUSCULAR | Status: DC | PRN
  Administered 2012-07-03 (×2): 1 mg via INTRAVENOUS
  Filled 2012-07-02 (×2): qty 1

## 2012-07-02 MED ORDER — OXYCODONE HCL 5 MG/5ML PO SOLN: 5.0000 mg | Freq: Once | ORAL | Status: DC | PRN

## 2012-07-02 MED ORDER — CEFAZOLIN SODIUM-DEXTROSE 2-3 GM-% IV SOLR
2.0000 g | INTRAVENOUS | Status: AC
  Administered 2012-07-02: 2 g via INTRAVENOUS

## 2012-07-02 MED ORDER — MORPHINE SULFATE 2 MG/ML IJ SOLN
1.0000 mg | INTRAMUSCULAR | Status: DC | PRN
  Administered 2012-07-03: 1 mg via INTRAVENOUS
  Filled 2012-07-02: qty 1

## 2012-07-02 MED ORDER — BUSPIRONE HCL 5 MG PO TABS
5.0000 mg | ORAL_TABLET | Freq: Two times a day (BID) | ORAL | Status: DC
Start: 2012-07-03 — End: 2012-07-03
  Filled 2012-07-02 (×2): qty 1

## 2012-07-02 MED ORDER — LIDOCAINE HCL (CARDIAC) 20 MG/ML IV SOLN
INTRAVENOUS | Status: DC | PRN
  Administered 2012-07-02: 100 mg via INTRAVENOUS

## 2012-07-02 MED ORDER — ARTIFICIAL TEARS OP OINT
TOPICAL_OINTMENT | OPHTHALMIC | Status: DC | PRN
  Administered 2012-07-02: 1 via OPHTHALMIC

## 2012-07-02 MED ORDER — DEXTROSE 5 % IV SOLN
INTRAVENOUS | Status: DC | PRN
  Administered 2012-07-02: 16:00:00 via INTRAVENOUS

## 2012-07-02 SURGICAL SUPPLY — 64 items
3.5x18mm locking screw ×1 IMPLANT
BANDAGE ELASTIC 4 VELCRO ST LF (GAUZE/BANDAGES/DRESSINGS) ×1 IMPLANT
BANDAGE GAUZE ELAST BULKY 4 IN (GAUZE/BANDAGES/DRESSINGS) ×1 IMPLANT
BIT DRILL 3.5 QC 155 (BIT) ×1 IMPLANT
BIT DRILL QC 2.7 6.3IN  SHORT (BIT) ×2
BIT DRILL QC 2.7 6.3IN SHORT (BIT) IMPLANT
CLOTH BEACON ORANGE TIMEOUT ST (SAFETY) ×2 IMPLANT
DRAPE C-ARM 42X72 X-RAY (DRAPES) ×1 IMPLANT
DRAPE C-ARM MINI 42X72 WSTRAPS (DRAPES) ×1 IMPLANT
DRAPE INCISE IOBAN 66X45 STRL (DRAPES) ×2 IMPLANT
DRAPE U-SHAPE 47X51 STRL (DRAPES) ×2 IMPLANT
DRSG PAD ABDOMINAL 8X10 ST (GAUZE/BANDAGES/DRESSINGS) ×2 IMPLANT
DURAPREP 26ML APPLICATOR (WOUND CARE) ×1 IMPLANT
ELECT REM PT RETURN 9FT ADLT (ELECTROSURGICAL) ×2
ELECTRODE REM PT RTRN 9FT ADLT (ELECTROSURGICAL) ×1 IMPLANT
GAUZE XEROFORM 1X8 LF (GAUZE/BANDAGES/DRESSINGS) ×1 IMPLANT
GLOVE BIO SURGEON STRL SZ7.5 (GLOVE) ×1 IMPLANT
GLOVE BIOGEL PI IND STRL 8 (GLOVE) ×2 IMPLANT
GLOVE BIOGEL PI INDICATOR 8 (GLOVE) ×2
GLOVE ECLIPSE 7.5 STRL STRAW (GLOVE) ×1 IMPLANT
GLOVE ECLIPSE 8.0 STRL XLNG CF (GLOVE) ×1 IMPLANT
GLOVE ORTHO TXT STRL SZ7.5 (GLOVE) ×2 IMPLANT
GOWN PREVENTION PLUS LG XLONG (DISPOSABLE) ×1 IMPLANT
GOWN PREVENTION PLUS XLARGE (GOWN DISPOSABLE) ×1 IMPLANT
GOWN PREVENTION PLUS XXLARGE (GOWN DISPOSABLE) ×1 IMPLANT
GOWN STRL NON-REIN LRG LVL3 (GOWN DISPOSABLE) ×1 IMPLANT
GOWN STRL REIN XL XLG (GOWN DISPOSABLE) ×1 IMPLANT
K-WIRE 1.6 (WIRE) ×6
K-WIRE FX150X1.6XTROC PNT (WIRE) ×3
KIT BASIN OR (CUSTOM PROCEDURE TRAY) ×2 IMPLANT
KIT ROOM TURNOVER OR (KITS) ×2 IMPLANT
KWIRE FX150X1.6XTROC PNT (WIRE) IMPLANT
MANIFOLD NEPTUNE II (INSTRUMENTS) ×2 IMPLANT
NEEDLE 22X1 1/2 (OR ONLY) (NEEDLE) IMPLANT
NS IRRIG 1000ML POUR BTL (IV SOLUTION) ×2 IMPLANT
PACK SHOULDER (CUSTOM PROCEDURE TRAY) ×2 IMPLANT
PAD ARMBOARD 7.5X6 YLW CONV (MISCELLANEOUS) ×4 IMPLANT
PAD CAST 4YDX4 CTTN HI CHSV (CAST SUPPLIES) IMPLANT
PADDING CAST COTTON 4X4 STRL (CAST SUPPLIES) ×2
PLATE LOCK 12 HOLE (Plate) ×1 IMPLANT
SCREW 3.5X24MM (Screw) ×1 IMPLANT
SCREW CORT T20 20X3.5XST NS LF (Screw) IMPLANT
SCREW CORTICAL 3.5X20 (Screw) ×2 IMPLANT
SCREW CORTICAL 3.5X22 (Screw) ×1 IMPLANT
SCREW LCK T20 CUT FLUT 16X3.5X (Screw) IMPLANT
SCREW LOCK 3.5X16 (Screw) ×2 IMPLANT
SCREW LOCK 3.5X26MM (Screw) ×2 IMPLANT
SCREW NL 3.5X18 (Screw) ×2 IMPLANT
SCREW NL 3.5X26 (Screw) ×3 IMPLANT
SCREW NON LOCK 3.5X16MM (Screw) ×1 IMPLANT
SLING ARM IMMOBILIZER MED (SOFTGOODS) ×1 IMPLANT
SPONGE GAUZE 4X4 12PLY (GAUZE/BANDAGES/DRESSINGS) ×1 IMPLANT
SPONGE LAP 4X18 X RAY DECT (DISPOSABLE) ×3 IMPLANT
STAPLER VISISTAT 35W (STAPLE) ×1 IMPLANT
SUCTION FRAZIER TIP 10 FR DISP (SUCTIONS) ×2 IMPLANT
SUT VIC AB 0 CT1 27 (SUTURE) ×4
SUT VIC AB 0 CT1 27XBRD ANBCTR (SUTURE) ×2 IMPLANT
SUT VIC AB 2-0 CT1 27 (SUTURE) ×4
SUT VIC AB 2-0 CT1 TAPERPNT 27 (SUTURE) ×2 IMPLANT
SYR CONTROL 10ML LL (SYRINGE) IMPLANT
TOWEL OR 17X24 6PK STRL BLUE (TOWEL DISPOSABLE) ×2 IMPLANT
TOWEL OR 17X26 10 PK STRL BLUE (TOWEL DISPOSABLE) ×2 IMPLANT
WATER STERILE IRR 1000ML POUR (IV SOLUTION) ×1 IMPLANT
YANKAUER SUCT BULB TIP NO VENT (SUCTIONS) ×1 IMPLANT

## 2012-07-02 NOTE — Preoperative (Addendum)
Beta Blockers   Tenormin 25 mgs taken on 07-02-12 @ 14:45

## 2012-07-02 NOTE — Transfer of Care (Signed)
Immediate Anesthesia Transfer of Care Note  Patient: Heather Padilla  Procedure(s) Performed: Procedure(s) (LRB) with comments: OPEN REDUCTION INTERNAL FIXATION (ORIF) HUMERAL SHAFT FRACTURE (Left) - Open Reduction Internal Fixation left humerus shaft fracture  Patient Location: PACU  Anesthesia Type:General and Regional  Level of Consciousness: awake, alert , oriented and patient cooperative  Airway & Oxygen Therapy: Patient Spontanous Breathing and Patient connected to nasal cannula oxygen  Post-op Assessment: Report given to PACU RN and Post -op Vital signs reviewed and stable  Post vital signs: Reviewed and stable  Complications: No apparent anesthesia complications

## 2012-07-02 NOTE — Anesthesia Preprocedure Evaluation (Addendum)
Anesthesia Evaluation  Patient identified by MRN, date of birth, ID band Patient awake    Reviewed: Allergy & Precautions, H&P , NPO status , Patient's Chart, lab work & pertinent test results  Airway Mallampati: II TM Distance: >3 FB Neck ROM: Full    Dental No notable dental hx. (+) Teeth Intact and Dental Advisory Given   Pulmonary neg pulmonary ROS,  breath sounds clear to auscultation  Pulmonary exam normal       Cardiovascular hypertension, On Home Beta Blockers and On Medications Rhythm:Regular Rate:Normal  02-Jul-2012 15:41:33 [facility] ROUTINE RECORD Normal sinus rhythm Cannot rule out Anterior infarct , age undetermined Abnormal   Neuro/Psych Anxiety Depression negative neurological ROS     GI/Hepatic Neg liver ROS, GERD-  Medicated and Controlled,  Endo/Other  negative endocrine ROS  Renal/GU negative Renal ROS  negative genitourinary   Musculoskeletal   Abdominal   Peds  Hematology negative hematology ROS (+)   Anesthesia Other Findings   Reproductive/Obstetrics negative OB ROS                        Anesthesia Physical Anesthesia Plan  ASA: II  Anesthesia Plan: General and Regional   Post-op Pain Management:    Induction: Intravenous  Airway Management Planned: Oral ETT and LMA  Additional Equipment:   Intra-op Plan:   Post-operative Plan: Extubation in OR  Informed Consent: I have reviewed the patients History and Physical, chart, labs and discussed the procedure including the risks, benefits and alternatives for the proposed anesthesia with the patient or authorized representative who has indicated his/her understanding and acceptance.   Dental advisory given  Plan Discussed with: CRNA  Anesthesia Plan Comments:        Anesthesia Quick Evaluation

## 2012-07-02 NOTE — Anesthesia Procedure Notes (Addendum)
Anesthesia Regional Block:  Interscalene brachial plexus block  Pre-Anesthetic Checklist: ,, timeout performed, Correct Patient, Correct Site, Correct Laterality, Correct Procedure, Correct Position, site marked, Risks and benefits discussed, pre-op evaluation,  At surgeon's request and post-op pain management  Laterality: Left  Prep: Maximum Sterile Barrier Precautions used and chloraprep       Needles:  Injection technique: Single-shot  Needle Type: Echogenic Stimulator Needle      Needle Gauge: 22 and 22 G    Additional Needles:  Procedures: ultrasound guided (picture in chart) and nerve stimulator Interscalene brachial plexus block  Nerve Stimulator or Paresthesia:  Response: Biceps response, 0.4 mA,   Additional Responses:   Narrative:  Start time: 07/02/2012 4:00 PM End time: 07/02/2012 4:10 PM Injection made incrementally with aspirations every 5 mL. Anesthesiologist: Sampson Goon, MD  Additional Notes: 2% Lidocaine skin wheel.   Interscalene brachial plexus block Procedure Name: LMA Insertion Date/Time: 07/02/2012 4:26 PM Performed by: Tyrone Nine Pre-anesthesia Checklist: Patient identified, Timeout performed, Emergency Drugs available, Suction available and Patient being monitored Oxygen Delivery Method: Circle system utilized Preoxygenation: Pre-oxygenation with 100% oxygen Intubation Type: IV induction Ventilation: Mask ventilation without difficulty LMA: LMA with gastric port inserted LMA Size: 4.0 Number of attempts: 1

## 2012-07-02 NOTE — Progress Notes (Signed)
Dr.Joslin notified of pt having 1/2 of a breakfast bar and 1/2 cup of water at 0730--ok to proceed

## 2012-07-02 NOTE — Brief Op Note (Signed)
07/02/2012  6:00 PM  PATIENT:  Heather Padilla  51 y.o. female  PRE-OPERATIVE DIAGNOSIS:  Left humeral shaft fracture   POST-OPERATIVE DIAGNOSIS:  Left humeral shaft fracture  PROCEDURE:  Procedure(s) (LRB) with comments: OPEN REDUCTION INTERNAL FIXATION (ORIF) HUMERAL SHAFT FRACTURE (Left) - Open Reduction Internal Fixation left humerus shaft fracture  SURGEON:  Surgeon(s) and Role:    * Kathryne Hitch, MD - Primary  PHYSICIAN ASSISTANT:   ASSISTANTS: none   ANESTHESIA:   regional and general  EBL:  Total I/O In: 2350 [I.V.:2350] Out: -   BLOOD ADMINISTERED:none  DRAINS: none   LOCAL MEDICATIONS USED:  NONE  SPECIMEN:  No Specimen  DISPOSITION OF SPECIMEN:  N/A  COUNTS:  YES  TOURNIQUET:  * No tourniquets in log *  DICTATION: .Other Dictation: Dictation Number [id number]  PLAN OF CARE: Admit for overnight observation  PATIENT DISPOSITION:  PACU - hemodynamically stable.   Delay start of Pharmacological VTE agent (>24hrs) due to surgical blood loss or risk of bleeding: no

## 2012-07-02 NOTE — H&P (Signed)
Heather Padilla is an 51 y.o. female.   Chief Complaint:   Left arm pain; known displaced humeral shaft fracture HPI:   51 yo female who sustained a mechanical fall late Saturday evening.  Seen in the ER and was found to have a fracture of her left humerus.  Placed in sling and given follow-up in my office.  I saw her yesterday and she was shown her x-rays.  She reports severe left arm pain and can "feel the bones moving."  The x-rays show significant dispalcement and shortening of her fracture.  It is recommended that she undergo ORIF of this fracture.  The risks include infection, nerve injury, and bleeding.  Past Medical History  Diagnosis Date  . GERD (gastroesophageal reflux disease)   . Hypertension   . Personal history of colonic polyps     2 cm tubulovillous adenoma  . Chronic LUQ pain     intermittent, ? splenic flexure syndrome    Past Surgical History  Procedure Date  . Colonoscopy 09/05/2007; 03/28/2011    2009: 2 cm Tubulovillous adenoma; 2012 Normal  . Cesarean section 1987  . Appendectomy   . Upper gastrointestinal endoscopy 09/05/2007    GERD    No family history on file. Social History:  reports that she quit smoking about 12 years ago. She has never used smokeless tobacco. She reports that she drinks about 3.6 ounces of alcohol per week. She reports that she does not use illicit drugs.  Allergies:  Allergies  Allergen Reactions  . Sulfa Antibiotics Rash    No prescriptions prior to admission    No results found for this or any previous visit (from the past 48 hour(s)). Dg Humerus Left  07/01/2012  *RADIOLOGY REPORT*  Clinical Data: Status post fall  LEFT HUMERUS - 2+ VIEW  Comparison: None.  Findings: There is a comminuted and oblique fracture deformity involving the proximal and mid shaft humerus. There is medial angulation of the distal fracture fragments.  There is medial displacement of the distal fracture fragments by one full shaft's width.   IMPRESSION:  1.  Comminuted fracture involves the proximal and mid shaft of the left humerus.  There is medial angulation and displacement of the distal fracture fragments.   Original Report Authenticated By: Signa Kell, M.D.     Review of Systems  All other systems reviewed and are negative.    There were no vitals taken for this visit. Physical Exam  Constitutional: She is oriented to person, place, and time. She appears well-developed and well-nourished.  HENT:  Head: Normocephalic and atraumatic.  Eyes: EOM are normal. Pupils are equal, round, and reactive to light.  Neck: Normal range of motion. Neck supple.  Cardiovascular: Normal rate and regular rhythm.   Respiratory: Effort normal and breath sounds normal.  GI: Soft. Bowel sounds are normal.  Musculoskeletal:       Left upper arm: She exhibits bony tenderness, swelling and deformity.  Neurological: She is alert and oriented to person, place, and time.  Skin: Skin is warm and dry.  Psychiatric: She has a normal mood and affect.     Assessment/Plan Left displaced humeral shaft fracture 1) to the OR today for surgical fixation with a plate and screws then admission overnight  Kazimierz Springborn Y 07/02/2012, 12:14 PM

## 2012-07-02 NOTE — Anesthesia Postprocedure Evaluation (Signed)
Anesthesia Post Note  Patient: Heather Padilla  Procedure(s) Performed: Procedure(s) (LRB): OPEN REDUCTION INTERNAL FIXATION (ORIF) HUMERAL SHAFT FRACTURE (Left)  Anesthesia type: general  Patient location: PACU  Post pain: Pain level controlled  Post assessment: Patient's Cardiovascular Status Stable  Last Vitals:  Filed Vitals:   07/02/12 1855  BP: 146/71  Pulse:   Temp:   Resp: 21    Post vital signs: Reviewed and stable  Level of consciousness: sedated  Complications: No apparent anesthesia complications

## 2012-07-03 ENCOUNTER — Encounter (HOSPITAL_COMMUNITY): Payer: Self-pay | Admitting: Orthopaedic Surgery

## 2012-07-03 MED ORDER — METHOCARBAMOL 500 MG PO TABS: 500.0000 mg | ORAL_TABLET | Freq: Four times a day (QID) | ORAL | Status: AC | PRN

## 2012-07-03 MED ORDER — OXYCODONE-ACETAMINOPHEN 5-325 MG PO TABS: 2.0000 | ORAL_TABLET | ORAL | Status: AC | PRN

## 2012-07-03 MED ORDER — ZOLPIDEM TARTRATE 5 MG PO TABS: 5.0000 mg | ORAL_TABLET | Freq: Every evening | ORAL | Status: DC | PRN

## 2012-07-03 NOTE — Discharge Summary (Signed)
Patient ID: SHADOW STIGGERS MRN: [id number] DOB/AGE: [birth year] 51 y.o.  Admit date: 07/02/2012 Discharge date: 07/03/2012  Admission Diagnoses:  Principal Problem:  *Fracture of shaft of left humerus   Discharge Diagnoses:  Same  Past Medical History  Diagnosis Date  . GERD (gastroesophageal reflux disease)   . Hypertension   . Personal history of colonic polyps     2 cm tubulovillous adenoma  . Chronic LUQ pain     intermittent, ? splenic flexure syndrome    Surgeries: Procedure(s): OPEN REDUCTION INTERNAL FIXATION (ORIF) HUMERAL SHAFT FRACTURE on 07/02/2012   Consultants:    Discharged Condition: Improved  Hospital Course: Heather Padilla is an 51 y.o. female who was admitted 07/02/2012 for operative treatment ofFracture of shaft of left humerus. Patient has severe unremitting pain that affects sleep, daily activities, and work/hobbies. After pre-op clearance the patient was taken to the operating room on 07/02/2012 and underwent  Procedure(s): OPEN REDUCTION INTERNAL FIXATION (ORIF) HUMERAL SHAFT FRACTURE.    Patient was given perioperative antibiotics: Anti-infectives     Start     Dose/Rate Route Frequency Ordered Stop   07/03/12 0030   ceFAZolin (ANCEF) IVPB 1 g/50 mL premix        1 g 100 mL/hr over 30 Minutes Intravenous Every 6 hours 07/02/12 2356 07/03/12 1829   07/02/12 1410   ceFAZolin (ANCEF) 2-3 GM-% IVPB SOLR     Comments: BREWER, JULIA: cabinet override         07/02/12 1410 07/03/12 0214   07/02/12 1408   ceFAZolin (ANCEF) IVPB 2 g/50 mL premix        2 g 100 mL/hr over 30 Minutes Intravenous 60 min pre-op 07/02/12 1408 07/02/12 1627           Patient was given sequential compression devices, early ambulation, and chemoprophylaxis to prevent DVT.  Patient benefited maximally from hospital stay and there were no complications.    Recent vital signs: Patient Vitals for the past 24 hrs:  BP Temp Temp src Pulse Resp  SpO2  07/03/12 0422 119/74 mmHg 99 F (37.2 C) - 102  16  100 %  07/02/12 2121 147/82 mmHg 99.3 F (37.4 C) - 79  16  97 %  07/02/12 2000 - 98.9 F (37.2 C) - 96  19  98 %  07/02/12 1955 144/77 mmHg - - 90  19  93 %  07/02/12 1940 141/77 mmHg - - 92  22  96 %  07/02/12 1935 - - - 93  21  94 %  07/02/12 1925 143/76 mmHg - - 91  20  94 %  07/02/12 1910 139/91 mmHg - - 90  17  95 %  07/02/12 1905 - - - 93  16  96 %  07/02/12 1855 146/71 mmHg - - - 21  -  07/02/12 1840 139/72 mmHg - - - 23  -  07/02/12 1835 - - - - 20  -  07/02/12 1825 141/73 mmHg - - - 14  -  07/02/12 1805 145/82 mmHg 98.5 F (36.9 C) - 104  24  100 %  07/02/12 1321 174/91 mmHg 98.8 F (37.1 C) Oral 73  18  99 %     Recent laboratory studies:  Basename 07/02/12 1413  WBC 10.5  HGB 11.4*  HCT 33.6*  PLT 192  NA 131*  K 4.1  CL 94*  CO2 28  BUN 10  CREATININE 0.86  GLUCOSE 119*  INR --  CALCIUM 9.9  Discharge Medications:     Medication List     As of 07/03/2012  7:31 AM    TAKE these medications         ALPRAZolam 0.5 MG tablet   Commonly known as: XANAX   Take 0.5 mg by mouth 2 (two) times daily as needed. For anxiety      atenolol 25 MG tablet   Commonly known as: TENORMIN   Take 25 mg by mouth daily.      busPIRone 10 MG tablet   Commonly known as: BUSPAR   Take 5 mg by mouth 2 (two) times daily.      lansoprazole 30 MG capsule   Commonly known as: PREVACID   Take 30 mg by mouth daily.      lisinopril 40 MG tablet   Commonly known as: PRINIVIL,ZESTRIL   Take 40 mg by mouth daily.      methocarbamol 500 MG tablet   Commonly known as: ROBAXIN   Take 1 tablet (500 mg total) by mouth every 6 (six) hours as needed.      oxyCODONE-acetaminophen 5-325 MG per tablet   Commonly known as: PERCOCET/ROXICET   Take 2 tablets by mouth every 4 (four) hours as needed. For pain        Diagnostic Studies: Dg Chest 2 View  07/02/2012  *RADIOLOGY REPORT*  Clinical Data: Hypertension   CHEST - 2 VIEW  Comparison: February 27, 2007  Findings: The lungs are clear.  The heart size and pulmonary vascularity are normal.  No adenopathy.  There is a fracture of the proximal left humerus.  IMPRESSION: Fracture proximal left humerus.  No edema or consolidation.   Original Report Authenticated By: Bretta Bang, M.D.    Dg Humerus Left  07/02/2012  *RADIOLOGY REPORT*  Clinical Data: Postop  LEFT HUMERUS - 2+ VIEW  Comparison: 07/01/2012  Findings: Skin staples overlie the humerus.  There has been interval ORIF of the fracture involving the mid and proximal shaft of the left humerus.  Side plate screw and device is in place.  The hardware components and fracture fragments are in anatomic alignment.  IMPRESSION:  1.  Status post ORIF of left humerus.  No complications noted.   Original Report Authenticated By: Signa Kell, M.D.    Dg Humerus Left  07/01/2012  *RADIOLOGY REPORT*  Clinical Data: Status post fall  LEFT HUMERUS - 2+ VIEW  Comparison: None.  Findings: There is a comminuted and oblique fracture deformity involving the proximal and mid shaft humerus. There is medial angulation of the distal fracture fragments.  There is medial displacement of the distal fracture fragments by one full shaft's width.  IMPRESSION:  1.  Comminuted fracture involves the proximal and mid shaft of the left humerus.  There is medial angulation and displacement of the distal fracture fragments.   Original Report Authenticated By: Signa Kell, M.D.     Disposition: 01-Home or Self Care      Discharge Orders    Future Orders Please Complete By Expires   Diet - low sodium heart healthy      Call MD / Call 911      Comments:   If you experience chest pain or shortness of breath, CALL 911 and be transported to the hospital emergency room.  If you develope a fever above 101 F, pus (white drainage) or increased drainage or redness at the wound, or calf pain, call your surgeon's office.   Constipation  Prevention  Comments:   Drink plenty of fluids.  Prune juice may be helpful.  You may use a stool softener, such as Colace (over the counter) 100 mg twice a day.  Use MiraLax (over the counter) for constipation as needed.   Increase activity slowly as tolerated      Discharge patient         Follow-up Information    Follow up with Kathryne Hitch, MD. In 2 weeks.   Contact information:   [address] [place] [address] [phone]           Signed: Kathryne Hitch 07/03/2012, 7:31 AM

## 2012-07-03 NOTE — Progress Notes (Signed)
Subjective: 1 Day Post-Op Procedure(s) (LRB): OPEN REDUCTION INTERNAL FIXATION (ORIF) HUMERAL SHAFT FRACTURE (Left) Patient reports pain as moderate.    Objective: Vital signs in last 24 hours: Temp:  [98.5 F (36.9 C)-99.3 F (37.4 C)] 99 F (37.2 C) (11/13 0422) Pulse Rate:  [73-104] 102  (11/13 0422) Resp:  [14-24] 16  (11/13 0422) BP: (119-174)/(71-91) 119/74 mmHg (11/13 0422) SpO2:  [93 %-100 %] 100 % (11/13 0422)  Intake/Output from previous day: 11/12 0701 - 11/13 0700 In: 2350 [I.V.:2350] Out: -  Intake/Output this shift:     Basename 07/02/12 1413  HGB 11.4*    Basename 07/02/12 1413  WBC 10.5  RBC 3.46*  HCT 33.6*  PLT 192    Basename 07/02/12 1413  NA 131*  K 4.1  CL 94*  CO2 28  BUN 10  CREATININE 0.86  GLUCOSE 119*  CALCIUM 9.9   No results found for this basename: LABPT:2,INR:2 in the last 72 hours  Intact pulses distally Incision: dressing C/D/I Compartment soft Radial nerve motor intact  Assessment/Plan: 1 Day Post-Op Procedure(s) (LRB): OPEN REDUCTION INTERNAL FIXATION (ORIF) HUMERAL SHAFT FRACTURE (Left) Discharge to home today.  BLACKMAN,CHRISTOPHER Y 07/03/2012, 7:26 AM

## 2012-07-04 NOTE — Op Note (Signed)
Heather Padilla, Heather Padilla   ACCOUNT NO.:  [id number]  MEDICAL RECORD NO.:  [id number]  LOCATION:                                 FACILITY:  PHYSICIAN:  Vanita Panda. Magnus Ivan, M.D.DATE OF BIRTH:  09-04-1960  DATE OF PROCEDURE:  07/02/2012 DATE OF DISCHARGE:                              OPERATIVE REPORT   PREOPERATIVE DIAGNOSIS:  Left humeral shaft fracture.  POSTOPERATIVE DIAGNOSIS:  Left humeral shaft fracture.  PROCEDURE:  Open reduction and internal fixation of left humeral shaft fracture.  IMPLANTS:  A 12 hole 3.5 mm Smith and Nephew locking plate.  SURGEON:  Doneen Poisson, MD.  ANESTHESIA: 1. Regional left upper extremity block. 2. General.  BLOOD LOSS:  Less than 200 mL.  COMPLICATIONS:  None.  INDICATIONS:  Jensen is a right-hand dominant 51 year old who had a mechanical fall late Saturday night.  She was seen in emergency room in the early morning hours of Sunday morning and found to have a humeral shaft fracture.  She was placed in a sling and followed up in our office.  I saw her yesterday and recognized that she had long fracture fragments with tenting of the soft tissues.  She is very uncomfortable and I recommended open reduction and internal fixation of this injury. She did agree to proceed with this due to her level of discomfort.  The risks and benefits of surgery were  explained to her in detail and she did wish to proceed with surgery.  PROCEDURE DESCRIPTION:  After informed consent was obtained, appropriate left arm was marked.  She was brought to the operating room.  After the left arm was marked, anesthesia obtained, regional anesthesia and then brought to the operating room, placed supine on the operating table. General anesthesia was then obtained.  Her left arm was placed on radiolucent arm table and was prepped and draped from the axilla down to the wrist with DuraPrep and sterile drapes including sterile stockinette.   Time-out was called to identify correct patient, correct left humerus.  I then made an anterior lateral approach to the humerus with a long skin incision.  I dissected down between the brachioradialis and brachialis distally and the brachialis and biceps and deltoid proximally.  I was able to easily expose the fracture and found a long split piece.  I was able to clean the fracture of hematoma and debris and then reduced it.  I held it with a single anterior to  posterior lag screw.  I then chose a Smith and Nephew 12-hole plate due to the long aspect of the fracture.  I fashioned this along the lateral cortex and then secured this with bicortical screws as well as locking screws on the end of the fractures.  This was quite proximal to distal fracture. Once I was done this,  then cleaned the soft tissue, skin with normal saline solution.  I assessed the fracture again, fixation visually as well as under direct fluoroscopy.  I then closed the deep tissue with 0 Vicryl followed by 2-0 Vicryl and subcutaneous tissue and interrupted staples on the skin. Xeroform followed by well-padded sterile dressing was applied.  She was placed back in her sling, awakened, extubated, taken to recovery room in stable condition.  All final  counts were correct.  There were no complications noted.     Vanita Panda. Magnus Ivan, M.D.     CYB/MEDQ  D:  07/02/2012  T:  07/03/2012  Job:  [id number]

## 2013-02-20 ENCOUNTER — Other Ambulatory Visit: Payer: Self-pay | Admitting: Family Medicine

## 2013-02-20 ENCOUNTER — Ambulatory Visit
Admission: RE | Admit: 2013-02-20 | Discharge: 2013-02-20 | Disposition: A | Discharge status: Home / Self Care | Payer: BC Managed Care – PPO | Source: Ambulatory Visit | Attending: Family Medicine | Admitting: Family Medicine

## 2013-02-20 DIAGNOSIS — M79609 Pain in unspecified limb: Secondary | ICD-10-CM

## 2013-06-26 ENCOUNTER — Other Ambulatory Visit: Payer: Self-pay

## 2013-09-25 ENCOUNTER — Other Ambulatory Visit: Payer: Self-pay | Admitting: Family Medicine

## 2013-09-25 ENCOUNTER — Ambulatory Visit
Admission: RE | Admit: 2013-09-25 | Discharge: 2013-09-25 | Disposition: A | Discharge status: Home / Self Care | Payer: BC Managed Care – PPO | Source: Ambulatory Visit | Attending: Family Medicine | Admitting: Family Medicine

## 2013-09-25 DIAGNOSIS — E871 Hypo-osmolality and hyponatremia: Secondary | ICD-10-CM

## 2015-06-29 ENCOUNTER — Encounter: Payer: Self-pay | Admitting: Internal Medicine

## 2016-05-01 ENCOUNTER — Encounter: Payer: Self-pay | Admitting: Internal Medicine

## 2017-01-01 ENCOUNTER — Ambulatory Visit (INDEPENDENT_AMBULATORY_CARE_PROVIDER_SITE_OTHER): Payer: Self-pay

## 2017-01-01 ENCOUNTER — Ambulatory Visit (INDEPENDENT_AMBULATORY_CARE_PROVIDER_SITE_OTHER): Payer: Self-pay | Admitting: Orthopaedic Surgery

## 2017-01-01 DIAGNOSIS — S42032A Displaced fracture of lateral end of left clavicle, initial encounter for closed fracture: Secondary | ICD-10-CM

## 2017-01-01 DIAGNOSIS — M25512 Pain in left shoulder: Secondary | ICD-10-CM

## 2017-01-01 MED ORDER — HYDROCODONE-ACETAMINOPHEN 5-325 MG PO TABS: 1.0000 | ORAL_TABLET | Freq: Three times a day (TID) | ORAL | 0 refills | Status: AC | PRN

## 2017-01-01 NOTE — Progress Notes (Signed)
Office Visit Note   Patient: Heather Padilla           Date of Birth: 02/21/1961           MRN: [id number] Visit Date: 01/01/2017              Requested by: Blair HeysEhinger, Robert, MD [address] [place], [place] [address] PCP: Blair HeysEhinger, Robert, MD   Assessment & Plan: Visit Diagnoses:  1. Left shoulder pain, unspecified chronicity   2. Closed displaced fracture of acromial end of left clavicle, initial encounter     Plan: I gave her reassurance that this fracture should do well with time. We'll place her in a sling to come in and out of his comfort allows. She understands avoid heavy lifting with her left arm and avoid overhead activities. When I see her back in 4 weeks I would like just an AP single view of the left shoulder. All questions were encouraged and answered. I did give her prescription for hydrocodone as well.  Follow-Up Instructions: Return in about 4 weeks (around 01/29/2017).   Orders:  Orders Placed This Encounter  Procedures  . XR Shoulder Left   Meds ordered this encounter  Medications  . HYDROcodone-acetaminophen (NORCO/VICODIN) 5-325 MG tablet    Sig: Take 1-2 tablets by mouth 3 (three) times daily as needed for moderate pain.    Dispense:  60 tablet    Refill:  0      Procedures: No procedures performed   Clinical Data: No additional findings.   Subjective: No chief complaint on file. The patient is a 56 year old I'm seeing is a new patient. I have seen her before but is been over 4-5 years now. She had open reduction-internal fixation of a humeral shaft fracture of the left humerus. She comes in today with acute left shoulder pain after falling last week. She tripped and fell her extension cord and landed hard on her left shoulder. She's had a bruising around her left shoulder some constant aching pain since then. She says she can't lay on that side well and she is not sleeping well. She denies any neck pain. She denies any  numbness and tingling in her left upper extremity or hand. She points to the shoulders a source of her pain.  HPI  Review of Systems She currently denies any headache, shortness of breath, fever, chills, nausea, vomiting.  Objective: Vital Signs: There were no vitals taken for this visit.  Physical Exam She is alert and oriented 3 and in no acute distress Ortho Exam Examination of her left shoulder shows bruising around the clavicle and into the proximal humerus. Her shoulders well located. She has pain over the lateral clavicle but no soft tissue compromise. She is neurovascularly intact distally with normal elbow wrist and hand exam on the left side. Specialty Comments:  No specialty comments available.  Imaging: Xr Shoulder Left  Result Date: 01/01/2017 3 views of the left shoulder show a lateral clavicle fracture with an intact acromioclavicular joint. The glenohumeral joint is well located. Her sole retained hardware from fixation of a humeral shaft fracture    PMFS History: Patient Active Problem List   Diagnosis Date Noted  . Left shoulder pain 01/01/2017  . Fracture of shaft of left humerus 07/02/2012  . Personal history of tubulovillous adenoma of colon 03/28/2011  . GERD (gastroesophageal reflux disease) 03/28/2011  . Hypertension 03/28/2011  . Personal history of failed moderate sedation 03/10/2011   Past Medical  History:  Diagnosis Date  . Chronic LUQ pain    intermittent, ? splenic flexure syndrome  . GERD (gastroesophageal reflux disease)   . Hypertension   . Personal history of colonic polyps    2 cm tubulovillous adenoma    No family history on file.  Past Surgical History:  Procedure Laterality Date  . APPENDECTOMY    . CESAREAN SECTION  1987  . COLONOSCOPY  09/05/2007; 03/28/2011   2009: 2 cm Tubulovillous adenoma; 2012 Normal  . ORIF HUMERUS FRACTURE  07/02/2012   Procedure: OPEN REDUCTION INTERNAL FIXATION (ORIF) HUMERAL SHAFT FRACTURE;  Surgeon:  Kathryne Hitch, MD;  Location: [facility] OR;  Service: Orthopedics;  Laterality: Left;  Open Reduction Internal Fixation left humerus shaft fracture  . UPPER GASTROINTESTINAL ENDOSCOPY  09/05/2007   GERD   Social History   Occupational History  . Not on file.   Social History Main Topics  . Smoking status: Former Smoker    Quit date: 12/26/1999  . Smokeless tobacco: Never Used  . Alcohol use 3.6 oz/week    6 Cans of beer per week     Comment: daily beer at night "  . Drug use: No  . Sexual activity: Not on file

## 2017-01-04 ENCOUNTER — Ambulatory Visit (INDEPENDENT_AMBULATORY_CARE_PROVIDER_SITE_OTHER): Payer: Self-pay | Admitting: Physician Assistant

## 2017-01-24 ENCOUNTER — Ambulatory Visit (INDEPENDENT_AMBULATORY_CARE_PROVIDER_SITE_OTHER): Payer: Self-pay | Admitting: Orthopaedic Surgery

## 2017-05-25 ENCOUNTER — Other Ambulatory Visit: Payer: Self-pay | Admitting: Family Medicine

## 2017-05-25 ENCOUNTER — Telehealth: Payer: Self-pay | Admitting: Acute Care

## 2017-05-25 DIAGNOSIS — Z87891 Personal history of nicotine dependence: Secondary | ICD-10-CM

## 2017-05-25 NOTE — Telephone Encounter (Signed)
Spoke with pt to schedule SDMV and CT.  PT quit smoking 15 or more years ago, therefore she does not qualifyfor ling cancer screening.  I explained to pt that Dr Manus Gunning can decide if he would like to schedule CT from his office.  Letter sent to Dr Manus Gunning to make him aware.  Referral has been cancelled.

## 2017-05-31 ENCOUNTER — Ambulatory Visit
Admission: RE | Admit: 2017-05-31 | Discharge: 2017-05-31 | Disposition: A | Discharge status: Home / Self Care | Payer: No Typology Code available for payment source | Source: Ambulatory Visit | Attending: Family Medicine | Admitting: Family Medicine

## 2017-05-31 DIAGNOSIS — Z87891 Personal history of nicotine dependence: Secondary | ICD-10-CM

## 2020-03-10 ENCOUNTER — Other Ambulatory Visit: Payer: Self-pay | Admitting: Family Medicine

## 2020-03-10 DIAGNOSIS — S0990XA Unspecified injury of head, initial encounter: Secondary | ICD-10-CM

## 2020-03-11 ENCOUNTER — Ambulatory Visit
Admission: RE | Admit: 2020-03-11 | Discharge: 2020-03-11 | Disposition: A | Discharge status: Home / Self Care | Payer: Self-pay | Source: Ambulatory Visit | Attending: Family Medicine | Admitting: Family Medicine

## 2020-03-11 ENCOUNTER — Ambulatory Visit
Admission: RE | Admit: 2020-03-11 | Discharge: 2020-03-11 | Disposition: A | Discharge status: Home / Self Care | Payer: No Typology Code available for payment source | Source: Ambulatory Visit | Attending: Family Medicine | Admitting: Family Medicine

## 2020-03-11 ENCOUNTER — Other Ambulatory Visit: Payer: Self-pay | Admitting: Family Medicine

## 2020-03-11 DIAGNOSIS — S0990XA Unspecified injury of head, initial encounter: Secondary | ICD-10-CM

## 2023-09-23 ENCOUNTER — Emergency Department (HOSPITAL_COMMUNITY): Payer: MEDICAID

## 2023-09-23 ENCOUNTER — Encounter (HOSPITAL_COMMUNITY): Payer: Self-pay

## 2023-09-23 ENCOUNTER — Inpatient Hospital Stay (HOSPITAL_COMMUNITY)
Admission: EM | Admit: 2023-09-23 | Discharge: 2023-10-20 | DRG: 082 | Disposition: E | Payer: MEDICAID | Attending: Pulmonary Disease | Admitting: Pulmonary Disease

## 2023-09-23 ENCOUNTER — Other Ambulatory Visit: Payer: Self-pay

## 2023-09-23 DIAGNOSIS — Z1152 Encounter for screening for COVID-19: Secondary | ICD-10-CM

## 2023-09-23 DIAGNOSIS — D72829 Elevated white blood cell count, unspecified: Secondary | ICD-10-CM | POA: Diagnosis present

## 2023-09-23 DIAGNOSIS — J9601 Acute respiratory failure with hypoxia: Secondary | ICD-10-CM | POA: Diagnosis present

## 2023-09-23 DIAGNOSIS — H5704 Mydriasis: Secondary | ICD-10-CM | POA: Diagnosis present

## 2023-09-23 DIAGNOSIS — H5702 Anisocoria: Secondary | ICD-10-CM | POA: Diagnosis present

## 2023-09-23 DIAGNOSIS — G935 Compression of brain: Secondary | ICD-10-CM | POA: Diagnosis present

## 2023-09-23 DIAGNOSIS — R404 Transient alteration of awareness: Secondary | ICD-10-CM | POA: Diagnosis present

## 2023-09-23 DIAGNOSIS — Z66 Do not resuscitate: Secondary | ICD-10-CM | POA: Diagnosis present

## 2023-09-23 DIAGNOSIS — S065XAA Traumatic subdural hemorrhage with loss of consciousness status unknown, initial encounter: Principal | ICD-10-CM | POA: Diagnosis present

## 2023-09-23 DIAGNOSIS — E872 Acidosis, unspecified: Secondary | ICD-10-CM | POA: Diagnosis present

## 2023-09-23 DIAGNOSIS — D62 Acute posthemorrhagic anemia: Secondary | ICD-10-CM | POA: Diagnosis not present

## 2023-09-23 DIAGNOSIS — Z529 Donor of unspecified organ or tissue: Secondary | ICD-10-CM | POA: Diagnosis not present

## 2023-09-23 DIAGNOSIS — D649 Anemia, unspecified: Secondary | ICD-10-CM | POA: Diagnosis present

## 2023-09-23 DIAGNOSIS — C801 Malignant (primary) neoplasm, unspecified: Secondary | ICD-10-CM

## 2023-09-23 DIAGNOSIS — F1010 Alcohol abuse, uncomplicated: Secondary | ICD-10-CM | POA: Diagnosis present

## 2023-09-23 DIAGNOSIS — G931 Anoxic brain damage, not elsewhere classified: Secondary | ICD-10-CM | POA: Diagnosis not present

## 2023-09-23 DIAGNOSIS — W19XXXA Unspecified fall, initial encounter: Secondary | ICD-10-CM | POA: Diagnosis present

## 2023-09-23 DIAGNOSIS — Z515 Encounter for palliative care: Secondary | ICD-10-CM

## 2023-09-23 DIAGNOSIS — R000 Tachycardia, unspecified: Secondary | ICD-10-CM | POA: Diagnosis present

## 2023-09-23 DIAGNOSIS — R4020 Unspecified coma: Secondary | ICD-10-CM | POA: Diagnosis present

## 2023-09-23 DIAGNOSIS — I619 Nontraumatic intracerebral hemorrhage, unspecified: Secondary | ICD-10-CM | POA: Diagnosis not present

## 2023-09-23 DIAGNOSIS — I6200 Nontraumatic subdural hemorrhage, unspecified: Secondary | ICD-10-CM

## 2023-09-23 LAB — I-STAT CHEM 8, ED
BUN: 8 mg/dL (ref 8–23)
Calcium, Ion: 1.07 mmol/L — ABNORMAL LOW (ref 1.15–1.40)
Chloride: 100 mmol/L (ref 98–111)
Creatinine, Ser: 0.7 mg/dL (ref 0.44–1.00)
Glucose, Bld: 158 mg/dL — ABNORMAL HIGH (ref 70–99)
HCT: 40 % (ref 36.0–46.0)
Hemoglobin: 13.6 g/dL (ref 12.0–15.0)
Potassium: 3.6 mmol/L (ref 3.5–5.1)
Sodium: 132 mmol/L — ABNORMAL LOW (ref 135–145)
TCO2: 18 mmol/L — ABNORMAL LOW (ref 22–32)

## 2023-09-23 LAB — I-STAT ARTERIAL BLOOD GAS, ED
Acid-base deficit: 6 mmol/L — ABNORMAL HIGH (ref 0.0–2.0)
Bicarbonate: 19.3 mmol/L — ABNORMAL LOW (ref 20.0–28.0)
Calcium, Ion: 1.15 mmol/L (ref 1.15–1.40)
HCT: 34 % — ABNORMAL LOW (ref 36.0–46.0)
Hemoglobin: 11.6 g/dL — ABNORMAL LOW (ref 12.0–15.0)
O2 Saturation: 100 %
Patient temperature: 100.3
Potassium: 3.5 mmol/L (ref 3.5–5.1)
Sodium: 131 mmol/L — ABNORMAL LOW (ref 135–145)
TCO2: 20 mmol/L — ABNORMAL LOW (ref 22–32)
pCO2 arterial: 37 mm[Hg] (ref 32–48)
pH, Arterial: 7.33 — ABNORMAL LOW (ref 7.35–7.45)
pO2, Arterial: 525 mm[Hg] — ABNORMAL HIGH (ref 83–108)

## 2023-09-23 LAB — COMPREHENSIVE METABOLIC PANEL
ALT: 32 U/L (ref 0–44)
AST: 39 U/L (ref 15–41)
Albumin: 4.3 g/dL (ref 3.5–5.0)
Alkaline Phosphatase: 68 U/L (ref 38–126)
Anion gap: 17 — ABNORMAL HIGH (ref 5–15)
BUN: 8 mg/dL (ref 8–23)
CO2: 17 mmol/L — ABNORMAL LOW (ref 22–32)
Calcium: 9 mg/dL (ref 8.9–10.3)
Chloride: 98 mmol/L (ref 98–111)
Creatinine, Ser: 0.8 mg/dL (ref 0.44–1.00)
GFR, Estimated: >60 mL/min (ref >60)
Glucose, Bld: 156 mg/dL — ABNORMAL HIGH (ref 70–99)
Potassium: 3.4 mmol/L — ABNORMAL LOW (ref 3.5–5.1)
Sodium: 132 mmol/L — ABNORMAL LOW (ref 135–145)
Total Bilirubin: 1 mg/dL (ref 0.0–1.2)
Total Protein: 7.5 g/dL (ref 6.5–8.1)

## 2023-09-23 LAB — CBC
HCT: 34.8 % — ABNORMAL LOW (ref 36.0–46.0)
Hemoglobin: 12.1 g/dL (ref 12.0–15.0)
MCH: 33.3 pg (ref 26.0–34.0)
MCHC: 34.8 g/dL (ref 30.0–36.0)
MCV: 95.9 fL (ref 80.0–100.0)
Platelets: 226 10*3/uL (ref 150–400)
RBC: 3.63 MIL/uL — ABNORMAL LOW (ref 3.87–5.11)
RDW: 12.8 % (ref 11.5–15.5)
WBC: 20.7 10*3/uL — ABNORMAL HIGH (ref 4.0–10.5)
nRBC: 0 % (ref 0.0–0.2)

## 2023-09-23 LAB — I-STAT VENOUS BLOOD GAS, ED
Acid-base deficit: 8 mmol/L — ABNORMAL HIGH (ref 0.0–2.0)
Bicarbonate: 17.7 mmol/L — ABNORMAL LOW (ref 20.0–28.0)
Calcium, Ion: 1.1 mmol/L — ABNORMAL LOW (ref 1.15–1.40)
HCT: 39 % (ref 36.0–46.0)
Hemoglobin: 13.3 g/dL (ref 12.0–15.0)
O2 Saturation: 89 %
Potassium: 3.6 mmol/L (ref 3.5–5.1)
Sodium: 132 mmol/L — ABNORMAL LOW (ref 135–145)
TCO2: 19 mmol/L — ABNORMAL LOW (ref 22–32)
pCO2, Ven: 35.5 mm[Hg] — ABNORMAL LOW (ref 44–60)
pH, Ven: 7.306 (ref 7.25–7.43)
pO2, Ven: 62 mm[Hg] — ABNORMAL HIGH (ref 32–45)

## 2023-09-23 LAB — SAMPLE TO BLOOD BANK

## 2023-09-23 LAB — PROTIME-INR
INR: 1 (ref 0.8–1.2)
Prothrombin Time: 13.1 s (ref 11.4–15.2)

## 2023-09-23 LAB — MRSA NEXT GEN BY PCR, NASAL: MRSA by PCR Next Gen: NOT DETECTED

## 2023-09-23 LAB — GLUCOSE, CAPILLARY
Glucose-Capillary: 162 mg/dL — ABNORMAL HIGH (ref 70–99)
Glucose-Capillary: 140 mg/dL — ABNORMAL HIGH (ref 70–99)

## 2023-09-23 LAB — ETHANOL: Alcohol, Ethyl (B): <10 mg/dL (ref <10)

## 2023-09-23 LAB — I-STAT CG4 LACTIC ACID, ED: Lactic Acid, Venous: 3.7 mmol/L (ref 0.5–1.9)

## 2023-09-23 MED ORDER — OXYCODONE HCL 5 MG PO TABS
5.0000 mg | ORAL_TABLET | ORAL | Status: DC | PRN
Start: 2023-09-23 — End: 2023-09-27
  Administered 2023-09-25: 5 mg via ORAL
  Filled 2023-09-23: qty 1

## 2023-09-23 MED ORDER — DOCUSATE SODIUM 50 MG/5ML PO LIQD
100.0000 mg | Freq: Two times a day (BID) | ORAL | Status: DC
Start: 2023-09-23 — End: 2023-09-23

## 2023-09-23 MED ORDER — FENTANYL CITRATE PF 50 MCG/ML IJ SOSY
50.0000 ug | PREFILLED_SYRINGE | Freq: Once | INTRAMUSCULAR | Status: DC
Start: 2023-09-23 — End: 2023-09-23

## 2023-09-23 MED ORDER — POLYETHYLENE GLYCOL 3350 17 G PO PACK
17.0000 g | PACK | Freq: Every day | ORAL | Status: DC
Start: 2023-09-23 — End: 2023-09-23

## 2023-09-23 MED ORDER — ACETAMINOPHEN 500 MG PO TABS
1000.0000 mg | ORAL_TABLET | Freq: Four times a day (QID) | ORAL | Status: DC
Start: 2023-09-23 — End: 2023-09-23

## 2023-09-23 MED ORDER — ONDANSETRON 4 MG PO TBDP: 4.0000 mg | ORAL_TABLET | Freq: Four times a day (QID) | ORAL | Status: DC | PRN

## 2023-09-23 MED ORDER — ACETAMINOPHEN 325 MG PO TABS: 650.0000 mg | ORAL_TABLET | Freq: Four times a day (QID) | ORAL | Status: DC | PRN

## 2023-09-23 MED ORDER — IOHEXOL 350 MG/ML SOLN
75.0000 mL | Freq: Once | INTRAVENOUS | Status: AC | PRN
  Administered 2023-09-23: 75 mL via INTRAVENOUS

## 2023-09-23 MED ORDER — ORAL CARE MOUTH RINSE
15.0000 mL | OROMUCOSAL | Status: DC
  Administered 2023-09-23 – 2023-09-27 (×48): 15 mL via OROMUCOSAL

## 2023-09-23 MED ORDER — POLYETHYLENE GLYCOL 3350 17 G PO PACK: 17.0000 g | PACK | Freq: Every day | ORAL | Status: DC | PRN

## 2023-09-23 MED ORDER — ONDANSETRON HCL 4 MG/2ML IJ SOLN: 4.0000 mg | Freq: Four times a day (QID) | INTRAMUSCULAR | Status: DC | PRN

## 2023-09-23 MED ORDER — ORAL CARE MOUTH RINSE: 15.0000 mL | OROMUCOSAL | Status: DC | PRN

## 2023-09-23 MED ORDER — METOPROLOL TARTRATE 5 MG/5ML IV SOLN: 5.0000 mg | Freq: Four times a day (QID) | INTRAVENOUS | Status: DC | PRN

## 2023-09-23 MED ORDER — FENTANYL BOLUS VIA INFUSION: 50.0000 ug | INTRAVENOUS | Status: DC | PRN

## 2023-09-23 MED ORDER — METHOCARBAMOL 1000 MG/10ML IJ SOLN: 500.0000 mg | Freq: Three times a day (TID) | INTRAMUSCULAR | Status: DC

## 2023-09-23 MED ORDER — PROPOFOL 1000 MG/100ML IV EMUL
0.0000 ug/kg/min | INTRAVENOUS | Status: DC
  Administered 2023-09-23: 20 ug/kg/min via INTRAVENOUS

## 2023-09-23 MED ORDER — FAMOTIDINE IN NACL 20-0.9 MG/50ML-% IV SOLN
20.0000 mg | Freq: Two times a day (BID) | INTRAVENOUS | Status: DC
  Administered 2023-09-23 – 2023-09-27 (×8): 20 mg via INTRAVENOUS
  Filled 2023-09-23 (×8): qty 50

## 2023-09-23 MED ORDER — SUCCINYLCHOLINE CHLORIDE 20 MG/ML IJ SOLN
INTRAMUSCULAR | Status: AC | PRN
  Administered 2023-09-23: 100 mg via INTRAVENOUS

## 2023-09-23 MED ORDER — HYDRALAZINE HCL 20 MG/ML IJ SOLN: 10.0000 mg | INTRAMUSCULAR | Status: DC | PRN

## 2023-09-23 MED ORDER — POTASSIUM CHLORIDE IN NACL 20-0.9 MEQ/L-% IV SOLN
INTRAVENOUS | Status: AC
  Filled 2023-09-23 (×2): qty 1000

## 2023-09-23 MED ORDER — ACETAMINOPHEN 650 MG RE SUPP: 650.0000 mg | Freq: Four times a day (QID) | RECTAL | Status: DC | PRN

## 2023-09-23 MED ORDER — SODIUM CHLORIDE 0.9 % IV SOLN: INTRAVENOUS | Status: DC

## 2023-09-23 MED ORDER — INSULIN ASPART 100 UNIT/ML IJ SOLN
0.0000 [IU] | INTRAMUSCULAR | Status: DC
  Administered 2023-09-23: 1 [IU] via SUBCUTANEOUS
  Administered 2023-09-23: 2 [IU] via SUBCUTANEOUS
  Administered 2023-09-24 – 2023-09-26 (×2): 1 [IU] via SUBCUTANEOUS

## 2023-09-23 MED ORDER — DOCUSATE SODIUM 100 MG PO CAPS: 100.0000 mg | ORAL_CAPSULE | Freq: Two times a day (BID) | ORAL | Status: DC

## 2023-09-23 MED ORDER — FAMOTIDINE 20 MG PO TABS: 20.0000 mg | ORAL_TABLET | Freq: Two times a day (BID) | ORAL | Status: DC

## 2023-09-23 MED ORDER — FENTANYL 2500MCG IN NS 250ML (10MCG/ML) PREMIX INFUSION: 50.0000 ug/h | INTRAVENOUS | Status: DC

## 2023-09-23 MED ORDER — CHLORHEXIDINE GLUCONATE CLOTH 2 % EX PADS
6.0000 | MEDICATED_PAD | Freq: Every day | CUTANEOUS | Status: DC
  Administered 2023-09-23 – 2023-09-27 (×5): 6 via TOPICAL

## 2023-09-23 MED ORDER — ETOMIDATE 2 MG/ML IV SOLN
INTRAVENOUS | Status: AC | PRN
  Administered 2023-09-23: 20 mg via INTRAVENOUS

## 2023-09-23 MED ORDER — ONDANSETRON HCL 4 MG PO TABS: 4.0000 mg | ORAL_TABLET | Freq: Four times a day (QID) | ORAL | Status: DC | PRN

## 2023-09-23 MED ORDER — DOCUSATE SODIUM 50 MG/5ML PO LIQD: 100.0000 mg | Freq: Two times a day (BID) | ORAL | Status: DC | PRN

## 2023-09-23 MED ORDER — METHOCARBAMOL 500 MG PO TABS: 500.0000 mg | ORAL_TABLET | Freq: Three times a day (TID) | ORAL | Status: DC

## 2023-09-23 NOTE — ED Provider Notes (Signed)
 [place] EMERGENCY DEPARTMENT AT [facility] Provider Note   CSN: [id number] Arrival date & time: 09/23/23  1738     History  Chief Complaint  Patient presents with   unresponsive    Heather Padilla is a 63 y.o. female.  Patient is a 63 year old female who presents today as a level 1 trauma due to an known fall last night and being unresponsive this morning.  Husband reported to EMS that patient has a history of abuse with pills and came home last night around 10:00 and seemed intoxicated.  She was yelling and found in the driveway by her husband who helped her into the house and into bed.  She seemed to be sleeping when he left this morning but when he got back approximately 4:00 this afternoon patient was not breathing well and unresponsive in the bed and he called 911.  When fire arrived patient was agonal he breathing and they assisted ventilation.  EMS arrived and noticed that patient's left pupil was dilated and not responsive.  They tried Narcan with no improvement and continued to bag-valve-mask.  They noted some decorticate posturing on exam and patient was noted to be hypertensive and route with systolics in the 200s.  The history is provided by the EMS personnel.       Home Medications Prior to Admission medications   Not on File      Allergies    Patient has no allergy information on record.    Review of Systems   Review of Systems  Physical Exam Updated Vital Signs BP (!) 148/78   Pulse (!) 112   Temp 100.3 F (37.9 C)   Resp (!) 23   Ht 5\' 7"  (1.702 m)   SpO2 100%  Physical Exam Constitutional:      General: She is in acute distress.     Appearance: She is toxic-appearing.  HENT:     Head:      Mouth/Throat:     Mouth: Mucous membranes are dry.  Eyes:     Comments: Right pupil is 2 mm and not reactive and left pupil is 4 mm and not reactive  Cardiovascular:     Rate and Rhythm: Tachycardia present.     Pulses: Normal  pulses.  Pulmonary:     Effort: Tachypnea and accessory muscle usage present.     Comments: Coarse breath sounds bilaterally Musculoskeletal:     Cervical back: Normal range of motion and neck supple.     Right lower leg: No edema.     Left lower leg: No edema.       Legs:  Neurological:     Comments: Patient is unresponsive with a GCS of 3.  No response to pain or posturing noted     ED Results / Procedures / Treatments   Labs (all labs ordered are listed, but only abnormal results are displayed) Labs Reviewed  COMPREHENSIVE METABOLIC PANEL - Abnormal; Notable for the following components:      Result Value   Sodium 132 (*)    Potassium 3.4 (*)    CO2 17 (*)    Glucose, Bld 156 (*)    Anion gap 17 (*)    All other components within normal limits  CBC - Abnormal; Notable for the following components:   WBC 20.7 (*)    RBC 3.63 (*)    HCT 34.8 (*)    All other components within normal limits  I-STAT CHEM 8, ED - Abnormal;  Notable for the following components:   Sodium 132 (*)    Glucose, Bld 158 (*)    Calcium, Ion 1.07 (*)    TCO2 18 (*)    All other components within normal limits  I-STAT CG4 LACTIC ACID, ED - Abnormal; Notable for the following components:   Lactic Acid, Venous 3.7 (*)    All other components within normal limits  I-STAT VENOUS BLOOD GAS, ED - Abnormal; Notable for the following components:   pCO2, Ven 35.5 (*)    pO2, Ven 62 (*)    Bicarbonate 17.7 (*)    TCO2 19 (*)    Acid-base deficit 8.0 (*)    Sodium 132 (*)    Calcium, Ion 1.10 (*)    All other components within normal limits  I-STAT ARTERIAL BLOOD GAS, ED - Abnormal; Notable for the following components:   pH, Arterial 7.330 (*)    pO2, Arterial 525 (*)    Bicarbonate 19.3 (*)    TCO2 20 (*)    Acid-base deficit 6.0 (*)    Sodium 131 (*)    HCT 34.0 (*)    Hemoglobin 11.6 (*)    All other components within normal limits  ETHANOL  PROTIME-INR  URINALYSIS, ROUTINE W REFLEX  MICROSCOPIC  RAPID URINE DRUG SCREEN, HOSP PERFORMED  BLOOD GAS, ARTERIAL  HIV ANTIBODY (ROUTINE TESTING W REFLEX)  CBC  BASIC METABOLIC PANEL  TRIGLYCERIDES  PROTIME-INR  APTT  SAMPLE TO BLOOD BANK    EKG None  Radiology CT HEAD WO CONTRAST Result Date: 09/23/2023 CLINICAL DATA:  Level 1 trauma.  Fall with posturing. EXAM: CT HEAD WITHOUT CONTRAST CT MAXILLOFACIAL WITHOUT CONTRAST CT CERVICAL SPINE WITHOUT CONTRAST TECHNIQUE: Multidetector CT imaging of the head, cervical spine, and maxillofacial structures were performed using the standard protocol without intravenous contrast. Multiplanar CT image reconstructions of the cervical spine and maxillofacial structures were also generated. RADIATION DOSE REDUCTION: This exam was performed according to the departmental dose-optimization program which includes automated exposure control, adjustment of the mA and/or kV according to patient size and/or use of iterative reconstruction technique. COMPARISON:  Head CT 03/11/2020 FINDINGS: CT HEAD FINDINGS Brain: A large acute subdural hematoma extending diffusely over the left cerebral convexity measures up to 2.5 cm in thickness. There is prominent mass effect on the underlying left cerebral hemisphere with partial effacement of the left lateral and third ventricles and 1.7 cm of rightward midline shift. Slight prominence of the right temporal horn could reflect early trapping. There is left uncal herniation and partial effacement of the basilar cisterns. A small amount of subdural hemorrhage is noted along the falx. No acute infarct is identified. Vascular: No hyperdense vessel. Skull: No acute fracture or suspicious osseous lesion. Other: None. CT MAXILLOFACIAL FINDINGS Osseous: No acute fracture, mandibular dislocation, or destructive process. Orbits: Bilateral cataract extraction. Sinuses: The paranasal sinuses and mastoid air cells are largely clear. Soft tissues: Hematoma in the left lateral face and  periorbital tissues. CT CERVICAL SPINE FINDINGS Alignment: Trace anterolisthesis of C5 on C6. Skull base and vertebrae: No acute fracture or suspicious osseous lesion. Soft tissues and spinal canal: No prevertebral fluid or swelling. No visible canal hematoma. Disc levels: Minor cervical spondylosis. Facet arthrosis predominantly at C5-6 where it is severe on the right. Upper chest: Reported separately on the dedicated chest CT. Other: Partially visualized endotracheal and enteric tubes. Critical Value/emergent results were called by telephone at the time of interpretation on 09/23/2023 at 6:19 pm to Dr. Sheliah Hatch, who verbally  acknowledged these results. IMPRESSION: 1. Large acute subdural hematoma over the left cerebral convexity with mass effect and herniation as described above including 1.7 cm of rightward midline shift. 2. No acute skull, maxillofacial, or cervical spine fracture. Electronically Signed   By: Sebastian Ache M.D.   On: 09/23/2023 18:33   CT CERVICAL SPINE WO CONTRAST Result Date: 09/23/2023 CLINICAL DATA:  Level 1 trauma.  Fall with posturing. EXAM: CT HEAD WITHOUT CONTRAST CT MAXILLOFACIAL WITHOUT CONTRAST CT CERVICAL SPINE WITHOUT CONTRAST TECHNIQUE: Multidetector CT imaging of the head, cervical spine, and maxillofacial structures were performed using the standard protocol without intravenous contrast. Multiplanar CT image reconstructions of the cervical spine and maxillofacial structures were also generated. RADIATION DOSE REDUCTION: This exam was performed according to the departmental dose-optimization program which includes automated exposure control, adjustment of the mA and/or kV according to patient size and/or use of iterative reconstruction technique. COMPARISON:  Head CT 03/11/2020 FINDINGS: CT HEAD FINDINGS Brain: A large acute subdural hematoma extending diffusely over the left cerebral convexity measures up to 2.5 cm in thickness. There is prominent mass effect on the underlying  left cerebral hemisphere with partial effacement of the left lateral and third ventricles and 1.7 cm of rightward midline shift. Slight prominence of the right temporal horn could reflect early trapping. There is left uncal herniation and partial effacement of the basilar cisterns. A small amount of subdural hemorrhage is noted along the falx. No acute infarct is identified. Vascular: No hyperdense vessel. Skull: No acute fracture or suspicious osseous lesion. Other: None. CT MAXILLOFACIAL FINDINGS Osseous: No acute fracture, mandibular dislocation, or destructive process. Orbits: Bilateral cataract extraction. Sinuses: The paranasal sinuses and mastoid air cells are largely clear. Soft tissues: Hematoma in the left lateral face and periorbital tissues. CT CERVICAL SPINE FINDINGS Alignment: Trace anterolisthesis of C5 on C6. Skull base and vertebrae: No acute fracture or suspicious osseous lesion. Soft tissues and spinal canal: No prevertebral fluid or swelling. No visible canal hematoma. Disc levels: Minor cervical spondylosis. Facet arthrosis predominantly at C5-6 where it is severe on the right. Upper chest: Reported separately on the dedicated chest CT. Other: Partially visualized endotracheal and enteric tubes. Critical Value/emergent results were called by telephone at the time of interpretation on 09/23/2023 at 6:19 pm to Dr. Sheliah Hatch, who verbally acknowledged these results. IMPRESSION: 1. Large acute subdural hematoma over the left cerebral convexity with mass effect and herniation as described above including 1.7 cm of rightward midline shift. 2. No acute skull, maxillofacial, or cervical spine fracture. Electronically Signed   By: Sebastian Ache M.D.   On: 09/23/2023 18:33   CT Maxillofacial Wo Contrast Result Date: 09/23/2023 CLINICAL DATA:  Level 1 trauma.  Fall with posturing. EXAM: CT HEAD WITHOUT CONTRAST CT MAXILLOFACIAL WITHOUT CONTRAST CT CERVICAL SPINE WITHOUT CONTRAST TECHNIQUE: Multidetector CT  imaging of the head, cervical spine, and maxillofacial structures were performed using the standard protocol without intravenous contrast. Multiplanar CT image reconstructions of the cervical spine and maxillofacial structures were also generated. RADIATION DOSE REDUCTION: This exam was performed according to the departmental dose-optimization program which includes automated exposure control, adjustment of the mA and/or kV according to patient size and/or use of iterative reconstruction technique. COMPARISON:  Head CT 03/11/2020 FINDINGS: CT HEAD FINDINGS Brain: A large acute subdural hematoma extending diffusely over the left cerebral convexity measures up to 2.5 cm in thickness. There is prominent mass effect on the underlying left cerebral hemisphere with partial effacement of the left lateral and third ventricles  and 1.7 cm of rightward midline shift. Slight prominence of the right temporal horn could reflect early trapping. There is left uncal herniation and partial effacement of the basilar cisterns. A small amount of subdural hemorrhage is noted along the falx. No acute infarct is identified. Vascular: No hyperdense vessel. Skull: No acute fracture or suspicious osseous lesion. Other: None. CT MAXILLOFACIAL FINDINGS Osseous: No acute fracture, mandibular dislocation, or destructive process. Orbits: Bilateral cataract extraction. Sinuses: The paranasal sinuses and mastoid air cells are largely clear. Soft tissues: Hematoma in the left lateral face and periorbital tissues. CT CERVICAL SPINE FINDINGS Alignment: Trace anterolisthesis of C5 on C6. Skull base and vertebrae: No acute fracture or suspicious osseous lesion. Soft tissues and spinal canal: No prevertebral fluid or swelling. No visible canal hematoma. Disc levels: Minor cervical spondylosis. Facet arthrosis predominantly at C5-6 where it is severe on the right. Upper chest: Reported separately on the dedicated chest CT. Other: Partially visualized  endotracheal and enteric tubes. Critical Value/emergent results were called by telephone at the time of interpretation on 09/23/2023 at 6:19 pm to Dr. Sheliah Hatch, who verbally acknowledged these results. IMPRESSION: 1. Large acute subdural hematoma over the left cerebral convexity with mass effect and herniation as described above including 1.7 cm of rightward midline shift. 2. No acute skull, maxillofacial, or cervical spine fracture. Electronically Signed   By: Sebastian Ache M.D.   On: 09/23/2023 18:33   CT CHEST ABDOMEN PELVIS W CONTRAST Result Date: 09/23/2023 CLINICAL DATA:  Poly trauma, blunt. Patient was found face down in driveway around 10 p.m. last night. History of substance use. Now found with agonal breathing and unresponsive. EXAM: CT CHEST, ABDOMEN, AND PELVIS WITH CONTRAST TECHNIQUE: Multidetector CT imaging of the chest, abdomen and pelvis was performed following the standard protocol during bolus administration of intravenous contrast. RADIATION DOSE REDUCTION: This exam was performed according to the departmental dose-optimization program which includes automated exposure control, adjustment of the mA and/or kV according to patient size and/or use of iterative reconstruction technique. COMPARISON:  Chest radiograph 09/23/2023. CT chest 05/31/2017. CT abdomen 03/07/2007. CT abdomen and pelvis 09/05/2005 FINDINGS: CT CHEST FINDINGS Cardiovascular: Normal heart size. No pericardial effusions. Normal caliber thoracic aorta. Calcification of the aorta. No dissection. Great vessel origins are patent. Mediastinum/Nodes: An enteric tube is present with tip in the stomach. An endotracheal tube is present with tip above the carina. Thyroid gland is unremarkable. Esophagus is decompressed. No significant lymphadenopathy. No mediastinal hematoma. Lungs/Pleura: Atelectasis or consolidation in the lung bases, greater on the left. No pneumothorax. No pleural effusions. Musculoskeletal: Old bilateral rib fractures.  Degenerative changes in the spine. Postoperative changes in the left humerus. CT ABDOMEN PELVIS FINDINGS Hepatobiliary: Mild diffuse fatty infiltration of the liver. No laceration or hematoma. Gallbladder and bile ducts are normal. Pancreas: Unremarkable. No pancreatic ductal dilatation or surrounding inflammatory changes. Spleen: No splenic injury or perisplenic hematoma. Adrenals/Urinary Tract: No adrenal hemorrhage or renal injury identified. Bladder is unremarkable. Stomach/Bowel: Stomach, small bowel, and colon are not abnormally distended. No wall thickening or inflammatory changes. Scattered colonic diverticula without evidence of acute diverticulitis. Appendix is not identified. Vascular/Lymphatic: Aortic atherosclerosis. No enlarged abdominal or pelvic lymph nodes. Reproductive: Uterus and bilateral adnexa are unremarkable. Other: No abdominal wall hernia or abnormality. No abdominopelvic ascites. Musculoskeletal: Degenerative changes in the spine. No acute bony abnormalities. IMPRESSION: 1. No acute posttraumatic changes demonstrated in the chest, abdomen, or pelvis. 2. Atelectasis or consolidation in the lung bases. 3. Endotracheal tube and enteric tube appear  in satisfactory position. 4. Fatty infiltration of the liver. 5. Aortic atherosclerosis. Critical Value/emergent results were called by telephone at the time of interpretation on 09/23/2023 at 6:15 pm to provider Feliciana Rossetti , who verbally acknowledged these results. Electronically Signed   By: Burman Nieves M.D.   On: 09/23/2023 18:21   DG Chest Port 1 View Result Date: 09/23/2023 CLINICAL DATA:  Trauma. Patient was found face down in driveway around 10 p.m. last night. History of substance abuse. Now found with agonal breathing and unresponsive. EXAM: PORTABLE CHEST 1 VIEW COMPARISON:  09/25/2013 FINDINGS: Endotracheal tube present with tip measuring 2.7 cm above the carina. Enteric tube is present. Tip is off the field of view but below the  left hemidiaphragm. Heart size and pulmonary vascularity are normal. Lungs are clear. No pleural effusion or pneumothorax. Old appearing bilateral rib fractures. Postoperative changes in the left humerus. Calcification of the aorta. IMPRESSION: Appliances appear in satisfactory position. Lungs are clear. Old rib fractures. Electronically Signed   By: Burman Nieves M.D.   On: 09/23/2023 18:08    Procedures Procedure Name: Intubation Date/Time: 09/23/2023 6:38 PM  Performed by: Gwyneth Sprout, MDPre-anesthesia Checklist: Patient identified Oxygen Delivery Method: Ambu bag Preoxygenation: Pre-oxygenation with 100% oxygen Induction Type: Rapid sequence Ventilation: Mask ventilation without difficulty Laryngoscope Size: 4 Tube size: 7.5 mm Number of attempts: 1 Airway Equipment and Method: Video-laryngoscopy Placement Confirmation: ETT inserted through vocal cords under direct vision and Breath sounds checked- equal and bilateral Secured at: 25 cm Tube secured with: ETT holder Dental Injury: Teeth and Oropharynx as per pre-operative assessment  Difficulty Due To: Difficulty was unanticipated        Medications Ordered in ED Medications  etomidate (AMIDATE) injection (20 mg Intravenous Given 09/23/23 1744)  succinylcholine (ANECTINE) injection (100 mg Intravenous Given 09/23/23 1744)  0.9 % NaCl with KCl 20 mEq/ L  infusion (has no administration in time range)  acetaminophen (TYLENOL) tablet 650 mg (has no administration in time range)    Or  acetaminophen (TYLENOL) suppository 650 mg (has no administration in time range)  oxyCODONE (Oxy IR/ROXICODONE) immediate release tablet 5 mg (has no administration in time range)  iohexol (OMNIPAQUE) 350 MG/ML injection 75 mL (75 mLs Intravenous Contrast Given 09/23/23 1816)    ED Course/ Medical Decision Making/ A&P                                 Medical Decision Making Amount and/or Complexity of Data Reviewed Labs: ordered.  Decision-making details documented in ED Course. Radiology: ordered and independent interpretation performed. Decision-making details documented in ED Course.  Risk Prescription drug management. Decision regarding hospitalization.   Pt with multiple medical problems and comorbidities and presenting today with a complaint that caries a high risk for morbidity and mortality.  Here today as a level 1 trauma due to known fall last night now unresponsive.  Upon arrival patient has a GCS of 3 and is not protecting her airway.  Neuroexam is concerning for possible intracranial hemorrhage versus massive stroke with herniation.  Also ecchymosis noted to the left side of the face as well as the knees.  No significant trauma to the torso.  Patient is hypertensive and tachycardic.  After intubation patient was started on propofol.  She went for images.I have independently visualized and interpreted pt's images today.  Cxr with appropriate tube placement.  Head CT with large intracranial hemorrhage.  NO other  acute findings on C/A/P.  I independently interpreted patient's labs and lactic acid is elevated 3.7, Chem-8 without acute findings, VBG with metabolic acidosis with respiratory compensation, CMP without acute findings, CBC with white count of 20,000, EtOH is normal.  Pt will need admission to ICU with trauma.  CRITICAL CARE Performed by: Jennavieve Arrick Total critical care time: 30 minutes Critical care time was exclusive of separately billable procedures and treating other patients. Critical care was necessary to treat or prevent imminent or life-threatening deterioration. Critical care was time spent personally by me on the following activities: development of treatment plan with patient and/or surrogate as well as nursing, discussions with consultants, evaluation of patient's response to treatment, examination of patient, obtaining history from patient or surrogate, ordering and performing treatments  and interventions, ordering and review of laboratory studies, ordering and review of radiographic studies, pulse oximetry and re-evaluation of patient's condition.        Final Clinical Impression(s) / ED Diagnoses Final diagnoses:  Subdural hemorrhage (HCC)  Fall, initial encounter    Rx / DC Orders ED Discharge Orders     None         Gwyneth Sprout, MD 09/23/23 1850

## 2023-09-23 NOTE — Progress Notes (Signed)
 Orthopedic Tech Progress Note Patient Details:  Heather Padilla 05/16/1961 [id number]  Level I trauma, no ortho tech needs at this moment.  Patient ID: Heather Padilla, female   DOB: 12/15/1960, 63 y.o.   MRN: [id number]  Docia Furl 09/23/2023, 5:52 PM

## 2023-09-23 NOTE — ED Notes (Signed)
Dr. Kingsinger at bedside. 

## 2023-09-23 NOTE — Plan of Care (Signed)

## 2023-09-23 NOTE — Consult Note (Signed)
 Reason for Consult:head injury Referring Physician: ED  Heather Padilla is an 63 y.o. female.  HPI: found by her husband last night approximately 2200 face down in their driveway. He brought her into the home. He left the home and found her on his return to be unresponsive with irregular breathing. Brought by ems to [facility]. My exam preceded her intubation.   History reviewed. No pertinent past medical history.  History reviewed. No pertinent surgical history.  History reviewed. No pertinent family history.  Social History:  reports that she has never smoked. She has never used smokeless tobacco. No history on file for alcohol use and drug use.  Allergies: Not on File  Medications: I have reviewed the patient's current medications.  Results for orders placed or performed during the hospital encounter of 09/23/23 (from the past 48 hours)  Comprehensive metabolic panel     Status: Abnormal   Collection Time: 09/23/23  5:41 PM  Result Value Ref Range   Sodium 132 (L) 135 - 145 mmol/L   Potassium 3.4 (L) 3.5 - 5.1 mmol/L   Chloride 98 98 - 111 mmol/L   CO2 17 (L) 22 - 32 mmol/L   Glucose, Bld 156 (H) 70 - 99 mg/dL    Comment: Glucose reference range applies only to samples taken after fasting for at least 8 hours.   BUN 8 8 - 23 mg/dL   Creatinine, Ser 1.61 0.44 - 1.00 mg/dL   Calcium 9.0 8.9 - 09.6 mg/dL   Total Protein 7.5 6.5 - 8.1 g/dL   Albumin 4.3 3.5 - 5.0 g/dL   AST 39 15 - 41 U/L   ALT 32 0 - 44 U/L   Alkaline Phosphatase 68 38 - 126 U/L   Total Bilirubin 1.0 0.0 - 1.2 mg/dL   GFR, Estimated >04 >54 mL/min    Comment: (NOTE) Calculated using the CKD-EPI Creatinine Equation (2021)    Anion gap 17 (H) 5 - 15    Comment: Performed at [facility], [address], [place], [place] [address]  CBC     Status: Abnormal   Collection Time: 09/23/23  5:41 PM  Result Value Ref Range   WBC 20.7 (H) 4.0 - 10.5 K/uL    Comment: WHITE COUNT CONFIRMED ON SMEAR    RBC 3.63 (L) 3.87 - 5.11 MIL/uL   Hemoglobin 12.1 12.0 - 15.0 g/dL   HCT 91.4 (L) 78.2 - 95.6 %   MCV 95.9 80.0 - 100.0 fL   MCH 33.3 26.0 - 34.0 pg   MCHC 34.8 30.0 - 36.0 g/dL   RDW 21.3 08.6 - 57.8 %   Platelets 226 150 - 400 K/uL   nRBC 0.0 0.0 - 0.2 %    Comment: Performed at [facility], [address], [place], [place] [address]  Ethanol     Status: None   Collection Time: 09/23/23  5:41 PM  Result Value Ref Range   Alcohol, Ethyl (B) <10 <10 mg/dL    Comment: (NOTE) Lowest detectable limit for serum alcohol is 10 mg/dL.  For medical purposes only. Performed at [facility], [address], [place], [place] [address]   Protime-INR     Status: None   Collection Time: 09/23/23  5:41 PM  Result Value Ref Range   Prothrombin Time 13.1 11.4 - 15.2 seconds   INR 1.0 0.8 - 1.2    Comment: (NOTE) INR goal varies based on device and disease states. Performed at  [facility], [address] [place], [place] [address]   Sample to Blood Bank     Status: None   Collection Time: 09/23/23  5:41 PM  Result Value Ref Range   Blood Bank Specimen SAMPLE AVAILABLE FOR TESTING    Sample Expiration      09/26/2023,2359 Performed at [facility], [address] [place], [place] [address]   I-Stat Chem 8, ED     Status: Abnormal   Collection Time: 09/23/23  5:49 PM  Result Value Ref Range   Sodium 132 (L) 135 - 145 mmol/L   Potassium 3.6 3.5 - 5.1 mmol/L   Chloride 100 98 - 111 mmol/L   BUN 8 8 - 23 mg/dL   Creatinine, Ser 0.98 0.44 - 1.00 mg/dL   Glucose, Bld 119 (H) 70 - 99 mg/dL    Comment: Glucose reference range applies only to samples taken after fasting for at least 8 hours.   Calcium, Ion 1.07 (L) 1.15 - 1.40 mmol/L   TCO2 18 (L) 22 - 32 mmol/L   Hemoglobin 13.6 12.0 - 15.0 g/dL   HCT 14.7 82.9 - 56.2 %  I-Stat Lactic Acid, ED     Status: Abnormal   Collection Time: 09/23/23  5:49 PM  Result Value Ref Range   Lactic Acid, Venous 3.7 (HH) 0.5 -  1.9 mmol/L   Comment NOTIFIED PHYSICIAN   I-Stat venous blood gas, ED     Status: Abnormal   Collection Time: 09/23/23  5:49 PM  Result Value Ref Range   pH, Ven 7.306 7.25 - 7.43   pCO2, Ven 35.5 (L) 44 - 60 mmHg   pO2, Ven 62 (H) 32 - 45 mmHg   Bicarbonate 17.7 (L) 20.0 - 28.0 mmol/L   TCO2 19 (L) 22 - 32 mmol/L   O2 Saturation 89 %   Acid-base deficit 8.0 (H) 0.0 - 2.0 mmol/L   Sodium 132 (L) 135 - 145 mmol/L   Potassium 3.6 3.5 - 5.1 mmol/L   Calcium, Ion 1.10 (L) 1.15 - 1.40 mmol/L   HCT 39.0 36.0 - 46.0 %   Hemoglobin 13.3 12.0 - 15.0 g/dL   Sample type VENOUS     CT HEAD WO CONTRAST Result Date: 09/23/2023 CLINICAL DATA:  Level 1 trauma.  Fall with posturing. EXAM: CT HEAD WITHOUT CONTRAST CT MAXILLOFACIAL WITHOUT CONTRAST CT CERVICAL SPINE WITHOUT CONTRAST TECHNIQUE: Multidetector CT imaging of the head, cervical spine, and maxillofacial structures were performed using the standard protocol without intravenous contrast. Multiplanar CT image reconstructions of the cervical spine and maxillofacial structures were also generated. RADIATION DOSE REDUCTION: This exam was performed according to the departmental dose-optimization program which includes automated exposure control, adjustment of the mA and/or kV according to patient size and/or use of iterative reconstruction technique. COMPARISON:  Head CT 03/11/2020 FINDINGS: CT HEAD FINDINGS Brain: A large acute subdural hematoma extending diffusely over the left cerebral convexity measures up to 2.5 cm in thickness. There is prominent mass effect on the underlying left cerebral hemisphere with partial effacement of the left lateral and third ventricles and 1.7 cm of rightward midline shift. Slight prominence of the right temporal horn could reflect early trapping. There is left uncal herniation and partial effacement of the basilar cisterns. A small amount of subdural hemorrhage is noted along the falx. No acute infarct is identified.  Vascular: No hyperdense vessel. Skull: No acute fracture or suspicious osseous lesion. Other: None. CT MAXILLOFACIAL FINDINGS Osseous: No acute fracture, mandibular  dislocation, or destructive process. Orbits: Bilateral cataract extraction. Sinuses: The paranasal sinuses and mastoid air cells are largely clear. Soft tissues: Hematoma in the left lateral face and periorbital tissues. CT CERVICAL SPINE FINDINGS Alignment: Trace anterolisthesis of C5 on C6. Skull base and vertebrae: No acute fracture or suspicious osseous lesion. Soft tissues and spinal canal: No prevertebral fluid or swelling. No visible canal hematoma. Disc levels: Minor cervical spondylosis. Facet arthrosis predominantly at C5-6 where it is severe on the right. Upper chest: Reported separately on the dedicated chest CT. Other: Partially visualized endotracheal and enteric tubes. Critical Value/emergent results were called by telephone at the time of interpretation on 09/23/2023 at 6:19 pm to Dr. Sheliah Hatch, who verbally acknowledged these results. IMPRESSION: 1. Large acute subdural hematoma over the left cerebral convexity with mass effect and herniation as described above including 1.7 cm of rightward midline shift. 2. No acute skull, maxillofacial, or cervical spine fracture. Electronically Signed   By: Sebastian Ache M.D.   On: 09/23/2023 18:33   CT CERVICAL SPINE WO CONTRAST Result Date: 09/23/2023 CLINICAL DATA:  Level 1 trauma.  Fall with posturing. EXAM: CT HEAD WITHOUT CONTRAST CT MAXILLOFACIAL WITHOUT CONTRAST CT CERVICAL SPINE WITHOUT CONTRAST TECHNIQUE: Multidetector CT imaging of the head, cervical spine, and maxillofacial structures were performed using the standard protocol without intravenous contrast. Multiplanar CT image reconstructions of the cervical spine and maxillofacial structures were also generated. RADIATION DOSE REDUCTION: This exam was performed according to the departmental dose-optimization program which includes  automated exposure control, adjustment of the mA and/or kV according to patient size and/or use of iterative reconstruction technique. COMPARISON:  Head CT 03/11/2020 FINDINGS: CT HEAD FINDINGS Brain: A large acute subdural hematoma extending diffusely over the left cerebral convexity measures up to 2.5 cm in thickness. There is prominent mass effect on the underlying left cerebral hemisphere with partial effacement of the left lateral and third ventricles and 1.7 cm of rightward midline shift. Slight prominence of the right temporal horn could reflect early trapping. There is left uncal herniation and partial effacement of the basilar cisterns. A small amount of subdural hemorrhage is noted along the falx. No acute infarct is identified. Vascular: No hyperdense vessel. Skull: No acute fracture or suspicious osseous lesion. Other: None. CT MAXILLOFACIAL FINDINGS Osseous: No acute fracture, mandibular dislocation, or destructive process. Orbits: Bilateral cataract extraction. Sinuses: The paranasal sinuses and mastoid air cells are largely clear. Soft tissues: Hematoma in the left lateral face and periorbital tissues. CT CERVICAL SPINE FINDINGS Alignment: Trace anterolisthesis of C5 on C6. Skull base and vertebrae: No acute fracture or suspicious osseous lesion. Soft tissues and spinal canal: No prevertebral fluid or swelling. No visible canal hematoma. Disc levels: Minor cervical spondylosis. Facet arthrosis predominantly at C5-6 where it is severe on the right. Upper chest: Reported separately on the dedicated chest CT. Other: Partially visualized endotracheal and enteric tubes. Critical Value/emergent results were called by telephone at the time of interpretation on 09/23/2023 at 6:19 pm to Dr. Sheliah Hatch, who verbally acknowledged these results. IMPRESSION: 1. Large acute subdural hematoma over the left cerebral convexity with mass effect and herniation as described above including 1.7 cm of rightward midline shift.  2. No acute skull, maxillofacial, or cervical spine fracture. Electronically Signed   By: Sebastian Ache M.D.   On: 09/23/2023 18:33   CT Maxillofacial Wo Contrast Result Date: 09/23/2023 CLINICAL DATA:  Level 1 trauma.  Fall with posturing. EXAM: CT HEAD WITHOUT CONTRAST CT MAXILLOFACIAL WITHOUT CONTRAST CT CERVICAL SPINE  WITHOUT CONTRAST TECHNIQUE: Multidetector CT imaging of the head, cervical spine, and maxillofacial structures were performed using the standard protocol without intravenous contrast. Multiplanar CT image reconstructions of the cervical spine and maxillofacial structures were also generated. RADIATION DOSE REDUCTION: This exam was performed according to the departmental dose-optimization program which includes automated exposure control, adjustment of the mA and/or kV according to patient size and/or use of iterative reconstruction technique. COMPARISON:  Head CT 03/11/2020 FINDINGS: CT HEAD FINDINGS Brain: A large acute subdural hematoma extending diffusely over the left cerebral convexity measures up to 2.5 cm in thickness. There is prominent mass effect on the underlying left cerebral hemisphere with partial effacement of the left lateral and third ventricles and 1.7 cm of rightward midline shift. Slight prominence of the right temporal horn could reflect early trapping. There is left uncal herniation and partial effacement of the basilar cisterns. A small amount of subdural hemorrhage is noted along the falx. No acute infarct is identified. Vascular: No hyperdense vessel. Skull: No acute fracture or suspicious osseous lesion. Other: None. CT MAXILLOFACIAL FINDINGS Osseous: No acute fracture, mandibular dislocation, or destructive process. Orbits: Bilateral cataract extraction. Sinuses: The paranasal sinuses and mastoid air cells are largely clear. Soft tissues: Hematoma in the left lateral face and periorbital tissues. CT CERVICAL SPINE FINDINGS Alignment: Trace anterolisthesis of C5 on C6.  Skull base and vertebrae: No acute fracture or suspicious osseous lesion. Soft tissues and spinal canal: No prevertebral fluid or swelling. No visible canal hematoma. Disc levels: Minor cervical spondylosis. Facet arthrosis predominantly at C5-6 where it is severe on the right. Upper chest: Reported separately on the dedicated chest CT. Other: Partially visualized endotracheal and enteric tubes. Critical Value/emergent results were called by telephone at the time of interpretation on 09/23/2023 at 6:19 pm to Dr. Sheliah Hatch, who verbally acknowledged these results. IMPRESSION: 1. Large acute subdural hematoma over the left cerebral convexity with mass effect and herniation as described above including 1.7 cm of rightward midline shift. 2. No acute skull, maxillofacial, or cervical spine fracture. Electronically Signed   By: Sebastian Ache M.D.   On: 09/23/2023 18:33   CT CHEST ABDOMEN PELVIS W CONTRAST Result Date: 09/23/2023 CLINICAL DATA:  Poly trauma, blunt. Patient was found face down in driveway around 10 p.m. last night. History of substance use. Now found with agonal breathing and unresponsive. EXAM: CT CHEST, ABDOMEN, AND PELVIS WITH CONTRAST TECHNIQUE: Multidetector CT imaging of the chest, abdomen and pelvis was performed following the standard protocol during bolus administration of intravenous contrast. RADIATION DOSE REDUCTION: This exam was performed according to the departmental dose-optimization program which includes automated exposure control, adjustment of the mA and/or kV according to patient size and/or use of iterative reconstruction technique. COMPARISON:  Chest radiograph 09/23/2023. CT chest 05/31/2017. CT abdomen 03/07/2007. CT abdomen and pelvis 09/05/2005 FINDINGS: CT CHEST FINDINGS Cardiovascular: Normal heart size. No pericardial effusions. Normal caliber thoracic aorta. Calcification of the aorta. No dissection. Great vessel origins are patent. Mediastinum/Nodes: An enteric tube is present  with tip in the stomach. An endotracheal tube is present with tip above the carina. Thyroid gland is unremarkable. Esophagus is decompressed. No significant lymphadenopathy. No mediastinal hematoma. Lungs/Pleura: Atelectasis or consolidation in the lung bases, greater on the left. No pneumothorax. No pleural effusions. Musculoskeletal: Old bilateral rib fractures. Degenerative changes in the spine. Postoperative changes in the left humerus. CT ABDOMEN PELVIS FINDINGS Hepatobiliary: Mild diffuse fatty infiltration of the liver. No laceration or hematoma. Gallbladder and bile ducts are normal. Pancreas:  Unremarkable. No pancreatic ductal dilatation or surrounding inflammatory changes. Spleen: No splenic injury or perisplenic hematoma. Adrenals/Urinary Tract: No adrenal hemorrhage or renal injury identified. Bladder is unremarkable. Stomach/Bowel: Stomach, small bowel, and colon are not abnormally distended. No wall thickening or inflammatory changes. Scattered colonic diverticula without evidence of acute diverticulitis. Appendix is not identified. Vascular/Lymphatic: Aortic atherosclerosis. No enlarged abdominal or pelvic lymph nodes. Reproductive: Uterus and bilateral adnexa are unremarkable. Other: No abdominal wall hernia or abnormality. No abdominopelvic ascites. Musculoskeletal: Degenerative changes in the spine. No acute bony abnormalities. IMPRESSION: 1. No acute posttraumatic changes demonstrated in the chest, abdomen, or pelvis. 2. Atelectasis or consolidation in the lung bases. 3. Endotracheal tube and enteric tube appear in satisfactory position. 4. Fatty infiltration of the liver. 5. Aortic atherosclerosis. Critical Value/emergent results were called by telephone at the time of interpretation on 09/23/2023 at 6:15 pm to provider Feliciana Rossetti , who verbally acknowledged these results. Electronically Signed   By: Burman Nieves M.D.   On: 09/23/2023 18:21   DG Chest Port 1 View Result Date:  09/23/2023 CLINICAL DATA:  Trauma. Patient was found face down in driveway around 10 p.m. last night. History of substance abuse. Now found with agonal breathing and unresponsive. EXAM: PORTABLE CHEST 1 VIEW COMPARISON:  09/25/2013 FINDINGS: Endotracheal tube present with tip measuring 2.7 cm above the carina. Enteric tube is present. Tip is off the field of view but below the left hemidiaphragm. Heart size and pulmonary vascularity are normal. Lungs are clear. No pleural effusion or pneumothorax. Old appearing bilateral rib fractures. Postoperative changes in the left humerus. Calcification of the aorta. IMPRESSION: Appliances appear in satisfactory position. Lungs are clear. Old rib fractures. Electronically Signed   By: Burman Nieves M.D.   On: 09/23/2023 18:08    Review of Systems  Unable to perform ROS: Acuity of condition (mental status)   Blood pressure (!) 180/92, pulse (!) 154, temperature 99.3 F (37.4 C), temperature source Temporal, resp. rate 18, height 5\' 7"  (1.702 m), SpO2 100%. Physical Exam Constitutional:      General: She is in acute distress.  Cardiovascular:     Rate and Rhythm: Normal rate.  Abdominal:     General: Abdomen is flat.  Skin:    Findings: Bruising, ecchymosis and signs of injury present.          Comments: Bruising on right side of face  Neurological:     Mental Status: She is unresponsive.     GCS: GCS eye subscore is 1. GCS verbal subscore is 1. GCS motor subscore is 1.     Cranial Nerves: Cranial nerve deficit present.     Deep Tendon Reflexes: Babinski sign absent on the right side. Babinski sign absent on the left side.     Comments: Pupils not reactive, asymmetric. Left pupil 5mm, right pupil 2mm No corneal reflex bilaterally No cough Agonal breathing No response to noxious stimuli Not following commands,      Assessment/Plan: Heather Padilla is a 63 y.o. female With a very large acute panhemispheric subdural hematoma with  substantial left to right midline shift, uncal herniation, ventricular effacement. Exam is very poor, prognosis is grave I believe operative intervention is futile given exam and imaging.  Coletta Memos 09/23/2023, 6:40 PM

## 2023-09-23 NOTE — ED Notes (Signed)
Dr Cabell at bedside 

## 2023-09-23 NOTE — Progress Notes (Signed)
 Patient ID: Heather Padilla, female   DOB: 1961-02-03, 63 y.o.   MRN: [id number] BP (!) 158/78   Pulse (!) 112   Temp 98.8 F (37.1 C) (Axillary)   Resp (!) 27   Ht 5\' 7"  (1.702 m)   Wt 62.6 kg   SpO2 100%   BMI 21.62 kg/m  I have spoken with her partner of 15 years. He states she does have a sister whom he will try to contact.  I explained that this injury is not survivable.

## 2023-09-23 NOTE — Consult Note (Cosign Needed Addendum)
 NAME:  Heather Padilla, MRN:  [id number], DOB:  07/01/1961, LOS: 0 ADMISSION DATE:  09/23/2023, CONSULTATION DATE:  09/23/23 REFERRING MD:  Franky Macho NSGY, CHIEF COMPLAINT: subdural hematoma   History of Present Illness:  63 year old female with no documented past medical history who presented to the emergency department 2/2 evening as a level 1 trauma after a fall last night and found unresponsive this morning.  Husband states that this morning she seemed to be sleeping and when he checked on her at 4 in the afternoon she was unresponsive.  With EMS noted to have blown left pupil, possible decorticate posturing, hypertensive with systolics in the 200s.  Patient was intubated immediately upon arrival to the ED.  She was noted to have a large subdural hematoma over the left cerebral convexity with 1.7 cm of midline shift and evidence of herniation.  Labs notable for Na 132, K 3.4, bicarb 17, AG 17, WBC 20.7, i-STAT lactic 3.7, ABG with pH 7.33, pCO2 37, pO2 525.  She was evaluated by neurosurgery shortly after her arrival.  Given very poor clinical exam, did not feel that operative intervention was appropriate.  Patient was admitted to ICU.  CCM consulted for vent management.  Pertinent  Medical History  None documented  Significant Hospital Events: Including procedures, antibiotic start and stop dates in addition to other pertinent events   2/2: ED as a level 1 trauma, unresponsive, intubated, CT head with large subdural and uncal herniation.  Admitted to ICU  Interim History / Subjective:  Patient unable to participate in the subjective portion of the exam. Significant other and friend at bedside. I have updated them on grave prognosis. Attempting to contact patient's sister to help in decision making.   Objective   Blood pressure (!) 158/78, pulse (!) 112, temperature 98.8 F (37.1 C), temperature source Axillary, resp. rate (!) 27, height 5\' 7"  (1.702 m), weight 62.6 kg, SpO2 100%.     Vent Mode: PRVC FiO2 (%):  [50 %-100 %] 50 % Set Rate:  [15 bmp] 15 bmp Vt Set:  [490 mL] 490 mL PEEP:  [5 cmH20] 5 cmH20 Plateau Pressure:  [18 cmH20] 18 cmH20   Intake/Output Summary (Last 24 hours) at 09/23/2023 2032 Last data filed at 09/23/2023 2000 Gross per 24 hour  Intake 2.91 ml  Output --  Net 2.91 ml   Filed Weights   09/23/23 1931  Weight: 62.6 kg    Examination: General: middle aged female, laying in bed, no acute distress HENT: right pupil 2mm sluggish, left pupil 5mm non reactive, ecchymosis about the left eye, ETT, OGT Lungs: rhonchi bilaterally, no wheezing, rales, vented 50/5  Cardiovascular: s1s2, sinus tachycardia, no murmur, rub, gallop Abdomen: rounded, soft, non-distended, OGT  Extremities: no pitting edema Neuro: +gag, right pupil 2mm sluggish, left pupil 5mm fixed and non reactive, no withdraw to pain in all 4 extremities. No corneal reflex.  GU: foley  Resolved Hospital Problem list    Assessment & Plan:  Acute left subdural hematoma Uncal herniation  CT head with large SDH over left cerebral convexity with mass effect and herniation. 1.7cm rightward midline shift. Poor neuro exam with left pupil fixed and dilated, does not withdraw to stimulus. +gag on suction. Neurosurgery declining operative intervention at this time.  - poor prognosis  - HTS, mannitol or surgical intervention per NSGY  - frequent neuro checks  - Neuroprotective measures: HOB > 30 degrees, normoglycemia, normothermia, electrolytes WNL - needs ongoing GOC discussion. Non survivable injury. Attempting  to contact biological sister who would be next of kin.   Acute hypoxic respiratory failure 2/2 above  Intubated on arrival to ED.  - full mechanical vent support - lung protective ventilation 6-8cc/kg Vt - VAP and PAD bundle in place  - titrate FiO2 to sat goal >92  - maintain peak/plats <30, driving pressures <16    Leukocytosis - reactive, monitor   AGMA  Lactic acidosis   - unclear amount of downtime prior to being found unresponsive.  - on con't IVF  - can trend for now   Goals of Care: significant other at bedside has been informed by Dr. Franky Macho with NSGY that this is a non-survivable injury. Attempts by significant other being made to contact patient's biological sister to help in medical decision making. I did discuss that given herniation syndrome, she may become more unstable prior to getting in contact with sister and possible decision making at that time. He is understandable.   Best Practice (right click and "Reselect all SmartList Selections" daily)   Diet/type: NPO DVT prophylaxis: SCD Pressure ulcer(s): na GI prophylaxis: H2B Lines: N/A Foley:  Yes, and it is still needed Code Status:  full code Last date of multidisciplinary goals of care discussion [pending]  Labs   CBC: Recent Labs  Lab 09/23/23 1741 09/23/23 1749 09/23/23 1843  WBC 20.7*  --   --   HGB 12.1 13.6  13.3 11.6*  HCT 34.8* 40.0  39.0 34.0*  MCV 95.9  --   --   PLT 226  --   --     Basic Metabolic Panel: Recent Labs  Lab 09/23/23 1741 09/23/23 1749 09/23/23 1843  NA 132* 132*  132* 131*  K 3.4* 3.6  3.6 3.5  CL 98 100  --   CO2 17*  --   --   GLUCOSE 156* 158*  --   BUN 8 8  --   CREATININE 0.80 0.70  --   CALCIUM 9.0  --   --    GFR: Estimated Creatinine Clearance: 69.1 mL/min (by C-G formula based on SCr of 0.7 mg/dL). Recent Labs  Lab 09/23/23 1741 09/23/23 1749  WBC 20.7*  --   LATICACIDVEN  --  3.7*    Liver Function Tests: Recent Labs  Lab 09/23/23 1741  AST 39  ALT 32  ALKPHOS 68  BILITOT 1.0  PROT 7.5  ALBUMIN 4.3   No results for input(s): "LIPASE", "AMYLASE" in the last 168 hours. No results for input(s): "AMMONIA" in the last 168 hours.  ABG    Component Value Date/Time   PHART 7.330 (L) 09/23/2023 1843   PCO2ART 37.0 09/23/2023 1843   PO2ART 525 (H) 09/23/2023 1843   HCO3 19.3 (L) 09/23/2023 1843   TCO2 20 (L)  09/23/2023 1843   ACIDBASEDEF 6.0 (H) 09/23/2023 1843   O2SAT 100 09/23/2023 1843     Coagulation Profile: Recent Labs  Lab 09/23/23 1741  INR 1.0    Cardiac Enzymes: No results for input(s): "CKTOTAL", "CKMB", "CKMBINDEX", "TROPONINI" in the last 168 hours.  HbA1C: No results found for: "HGBA1C"  CBG: No results for input(s): "GLUCAP" in the last 168 hours.  Review of Systems:   As above  Past Medical History:  She,  has no past medical history on file.   Surgical History:  History reviewed. No pertinent surgical history.   Social History:   reports that she has never smoked. She has never used smokeless tobacco.   Family History:  Her  family history is not on file.   Allergies Not on File   Home Medications  Prior to Admission medications   Not on File     Critical care time: 35   Lenard Galloway [facility] 09/23/23 9:11 PM  Please see [email or web address] for pager details.  From 7A-7P if no response, please call [phone] After hours, please call ELink [phone]

## 2023-09-23 NOTE — Progress Notes (Signed)
 eLink Physician-Brief Progress Note Patient Name: Heather Padilla DOB: 07/27/1961 MRN: [id number]   Date of Service  09/23/2023  HPI/Events of Note  63 year old female who initially presented as a level 1 trauma after a fall found to have anisocoria and imaging consistent with subdural hematoma.  Given poor clinical exam, neurosurgical intervention was deferred.  Patient is tachypneic, tachycardic, and hypertensive on exam.  Saturating 100%.  Off sedation.  Mild electrolyte abnormalities on metabolic panel leukocytosis and mild anemia.  eICU Interventions  Devastating neurological injury which appears nonsurvivable.  Ongoing goals of care conversation  GI prophylaxis with pantoprazole DVT prophylaxis contraindicated in the setting of intracranial hemorrhage        Bastian Andreoli 09/23/2023, 9:45 PM

## 2023-09-23 NOTE — Progress Notes (Addendum)
   09/23/23 1805  Spiritual Encounters  Type of Visit Initial  Care provided to: Patient  Referral source Trauma page  OnCall Visit Yes   Chaplain responded to Trauma Page. Pt unconscious and sent to CT. Chaplain will follow up to see if family/friends contacted.   Chaplain Daron Offer  2/2 at 1925: Chaplain followed up with Pt who is now in 4N19. Pt is still unconscious. Chaplain spoke to Pt's boyfriend and offered care and compassionate presence if he needs that and told him that chaplain is available if needed. No additional follow up needed.   Chaplain Daron Offer

## 2023-09-23 NOTE — H&P (Signed)
**Note Heather-Identified via Obfuscation**  Activation and Reason: level I, fall   Primary Survey: agonal breathing, after intubation breath sounds present bilaterally, pulses intact   Heather Padilla is an 63 y.o. female.  HPI: 63 yo female found down in her home. She is known to use narcotic pills and came home yesterday looking under the influence per husband. She was unresponsive this morning and 911 was called. She was agonal in the bay and intubated.   History reviewed. No pertinent past medical history.       History reviewed. No pertinent surgical history.       History reviewed. No pertinent family history.       Social History:  reports that she has never smoked. She has never used smokeless tobacco. No history on file for alcohol use and drug use.   Allergies:  Allergies  Not on File     Medications: I have reviewed the patient's current medications.   Lab Results Last 48 Hours        Results for orders placed or performed during the hospital encounter of 09/23/23 (from the past 48 hours)  CBC     Status: Abnormal (Preliminary result)    Collection Time: 09/23/23  5:41 PM  Result Value Ref Range    WBC PENDING 4.0 - 10.5 K/uL    RBC 3.63 (L) 3.87 - 5.11 MIL/uL    Hemoglobin 12.1 12.0 - 15.0 g/dL    HCT 78.2 (L) 95.6 - 46.0 %    MCV 95.9 80.0 - 100.0 fL    MCH 33.3 26.0 - 34.0 pg    MCHC 34.8 30.0 - 36.0 g/dL    RDW 21.3 08.6 - 57.8 %    Platelets 226 150 - 400 K/uL      Comment: Performed at [facility], [address] [place], [place] [address]    nRBC PENDING 0.0 - 0.2 %  Protime-INR     Status: None    Collection Time: 09/23/23  5:41 PM  Result Value Ref Range    Prothrombin Time 13.1 11.4 - 15.2 seconds    INR 1.0 0.8 - 1.2      Comment: (NOTE) INR goal varies based on device and disease states. Performed at [facility], [address], [place], [place] [address]    Sample to Blood Bank     Status: None    Collection Time: 09/23/23  5:41 PM  Result Value Ref  Range    Blood Bank Specimen SAMPLE AVAILABLE FOR TESTING      Sample Expiration          09/26/2023,2359 Performed at [facility], [address] [place], [place] [address]    I-Stat Chem 8, ED     Status: Abnormal    Collection Time: 09/23/23  5:49 PM  Result Value Ref Range    Sodium 132 (L) 135 - 145 mmol/L    Potassium 3.6 3.5 - 5.1 mmol/L    Chloride 100 98 - 111 mmol/L    BUN 8 8 - 23 mg/dL    Creatinine, Ser 0.10 0.44 - 1.00 mg/dL    Glucose, Bld 272 (H) 70 - 99 mg/dL      Comment: Glucose reference range applies only to samples taken after fasting for at least 8 hours.    Calcium, Ion 1.07 (L) 1.15 - 1.40 mmol/L    TCO2 18 (L) 22 - 32 mmol/L    Hemoglobin 13.6 12.0 - 15.0 g/dL  HCT 40.0 36.0 - 46.0 %  I-Stat Lactic Acid, ED     Status: Abnormal    Collection Time: 09/23/23  5:49 PM  Result Value Ref Range    Lactic Acid, Venous 3.7 (HH) 0.5 - 1.9 mmol/L    Comment NOTIFIED PHYSICIAN    I-Stat venous blood gas, ED     Status: Abnormal    Collection Time: 09/23/23  5:49 PM  Result Value Ref Range    pH, Ven 7.306 7.25 - 7.43    pCO2, Ven 35.5 (L) 44 - 60 mmHg    pO2, Ven 62 (H) 32 - 45 mmHg    Bicarbonate 17.7 (L) 20.0 - 28.0 mmol/L    TCO2 19 (L) 22 - 32 mmol/L    O2 Saturation 89 %    Acid-base deficit 8.0 (H) 0.0 - 2.0 mmol/L    Sodium 132 (L) 135 - 145 mmol/L    Potassium 3.6 3.5 - 5.1 mmol/L    Calcium, Ion 1.10 (L) 1.15 - 1.40 mmol/L    HCT 39.0 36.0 - 46.0 %    Hemoglobin 13.3 12.0 - 15.0 g/dL    Sample type VENOUS          Imaging Results (Last 48 hours)  No results found.     Review of Systems  Unable to perform ROS: Acuity of condition      PE Blood pressure (!) 180/92, pulse (!) 154, temperature 99.3 F (37.4 C), temperature source Temporal, resp. rate 18, SpO2 100%. Constitutional: NAD; conversant; large left face ecchymosis Eyes: Moist conjunctiva; no lid lag; anicteric; left pupil dilated Neck: Trachea midline; no thyromegaly,  unable to assess for pain Lungs: assisted respiratory effort; no tactile fremitus CV: RRR; no palpable thrills; no pitting edema GI: Abd soft; no palpable hepatosplenomegaly MSK: unable to assess gait; no clubbing/cyanosis Psychiatric: unresponsive Lymphatic: No palpable cervical or axillary lymphadenopathy   Assessment/Plan: 63 yo female fall with large left sided SDH. I reviewed the scan with Dr. Franky Macho who believes it is non-survivable -we will discuss with family -admit for care   Procedures: Intubated by Dr. Anitra Lauth   I reviewed last 24 h vitals and pain scores, last 48 h intake and output, last 24 h labs and trends, and last 24 h imaging results.   This care required high  level of medical decision making.    Heather Padilla 09/23/2023, 6:02 PM

## 2023-09-23 NOTE — Progress Notes (Signed)
 Patient transported from ED Trauma B to CT and back to ED Trauma B with RT and RNx2, no complications noted.

## 2023-09-23 NOTE — ED Triage Notes (Signed)
 Pt was out last night with friends and husband found pt face down in driveway around 10pm. Pt has hx of substance abuse. Today husband found pt agonal breathing and unresponsive. EMS gave 1 narcan

## 2023-09-23 NOTE — Consult Note (Signed)
 Activation and Reason: level I, fall  Primary Survey: agonal breathing, after intubation breath sounds present bilaterally, pulses intact  Heather Padilla is an 63 y.o. female.  HPI: 63 yo female found down in her home. She is known to use narcotic pills and came home yesterday looking under the influence per husband. She was unresponsive this morning and 911 was called. She was agonal in the bay and intubated.  History reviewed. No pertinent past medical history.  History reviewed. No pertinent surgical history.  History reviewed. No pertinent family history.  Social History:  reports that she has never smoked. She has never used smokeless tobacco. No history on file for alcohol use and drug use.  Allergies: Not on File  Medications: I have reviewed the patient's current medications.  Results for orders placed or performed during the hospital encounter of 09/23/23 (from the past 48 hours)  CBC     Status: Abnormal (Preliminary result)   Collection Time: 09/23/23  5:41 PM  Result Value Ref Range   WBC PENDING 4.0 - 10.5 K/uL   RBC 3.63 (L) 3.87 - 5.11 MIL/uL   Hemoglobin 12.1 12.0 - 15.0 g/dL   HCT 95.6 (L) 21.3 - 08.6 %   MCV 95.9 80.0 - 100.0 fL   MCH 33.3 26.0 - 34.0 pg   MCHC 34.8 30.0 - 36.0 g/dL   RDW 57.8 46.9 - 62.9 %   Platelets 226 150 - 400 K/uL    Comment: Performed at [facility], [address], [place], [place] [address]   nRBC PENDING 0.0 - 0.2 %  Protime-INR     Status: None   Collection Time: 09/23/23  5:41 PM  Result Value Ref Range   Prothrombin Time 13.1 11.4 - 15.2 seconds   INR 1.0 0.8 - 1.2    Comment: (NOTE) INR goal varies based on device and disease states. Performed at [facility], [address], [place], [place] [address]   Sample to Blood Bank     Status: None   Collection Time: 09/23/23  5:41 PM  Result Value Ref Range   Blood Bank Specimen SAMPLE AVAILABLE FOR TESTING    Sample Expiration       09/26/2023,2359 Performed at [facility], [address] [place], [place] [address]   I-Stat Chem 8, ED     Status: Abnormal   Collection Time: 09/23/23  5:49 PM  Result Value Ref Range   Sodium 132 (L) 135 - 145 mmol/L   Potassium 3.6 3.5 - 5.1 mmol/L   Chloride 100 98 - 111 mmol/L   BUN 8 8 - 23 mg/dL   Creatinine, Ser 5.36 0.44 - 1.00 mg/dL   Glucose, Bld 644 (H) 70 - 99 mg/dL    Comment: Glucose reference range applies only to samples taken after fasting for at least 8 hours.   Calcium, Ion 1.07 (L) 1.15 - 1.40 mmol/L   TCO2 18 (L) 22 - 32 mmol/L   Hemoglobin 13.6 12.0 - 15.0 g/dL   HCT 03.4 74.2 - 59.5 %  I-Stat Lactic Acid, ED     Status: Abnormal   Collection Time: 09/23/23  5:49 PM  Result Value Ref Range   Lactic Acid, Venous 3.7 (HH) 0.5 - 1.9 mmol/L   Comment NOTIFIED PHYSICIAN   I-Stat venous blood gas, ED     Status: Abnormal   Collection Time: 09/23/23  5:49 PM  Result Value Ref Range   pH, Ven 7.306  7.25 - 7.43   pCO2, Ven 35.5 (L) 44 - 60 mmHg   pO2, Ven 62 (H) 32 - 45 mmHg   Bicarbonate 17.7 (L) 20.0 - 28.0 mmol/L   TCO2 19 (L) 22 - 32 mmol/L   O2 Saturation 89 %   Acid-base deficit 8.0 (H) 0.0 - 2.0 mmol/L   Sodium 132 (L) 135 - 145 mmol/L   Potassium 3.6 3.5 - 5.1 mmol/L   Calcium, Ion 1.10 (L) 1.15 - 1.40 mmol/L   HCT 39.0 36.0 - 46.0 %   Hemoglobin 13.3 12.0 - 15.0 g/dL   Sample type VENOUS     No results found.  Review of Systems  Unable to perform ROS: Acuity of condition    PE Blood pressure (!) 180/92, pulse (!) 154, temperature 99.3 F (37.4 C), temperature source Temporal, resp. rate 18, SpO2 100%. Constitutional: NAD; conversant; large left face ecchymosis Eyes: Moist conjunctiva; no lid lag; anicteric; left pupil dilated Neck: Trachea midline; no thyromegaly, unable to assess for pain Lungs: assisted respiratory effort; no tactile fremitus CV: RRR; no palpable thrills; no pitting edema GI: Abd soft; no palpable  hepatosplenomegaly MSK: unable to assess gait; no clubbing/cyanosis Psychiatric: unresponsive Lymphatic: No palpable cervical or axillary lymphadenopathy   Assessment/Plan: 63 yo female fall with large left sided SDH. I reviewed the scan with Dr. Franky Macho who believes it is non-survivable -we will discuss with family -admit for care  Procedures: Intubated by Dr. Anitra Lauth  I reviewed last 24 h vitals and pain scores, last 48 h intake and output, last 24 h labs and trends, and last 24 h imaging results.  This care required high  level of medical decision making.   De Blanch Heather Padilla 09/23/2023, 6:02 PM

## 2023-09-23 NOTE — ED Notes (Signed)
 Trauma Response Nurse Documentation   Heather Padilla is a 63 y.o. female arriving to [facility] ED via [facility]  On No antithrombotic. Trauma was activated as a Level 1 by Charge RN based on the following trauma criteria GCS < 9.  Patient cleared for CT by Dr. Sheliah Hatch. Pt transported to CT with trauma response nurse present to monitor. RN remained with the patient throughout their absence from the department for clinical observation.   GCS 3.  Trauma MD Arrival Time: 1740.  History   History reviewed. No pertinent past medical history.   History reviewed. No pertinent surgical history.     Initial Focused Assessment (If applicable, or please see trauma documentation): Airway -- unable to protect airway - intubated on arrival by Dr. Anitra Lauth Breathing - Resp assisted w BVM per EMS on arrival to trauma room Circulation - strong peripheral pulses, but cool extremities  GCS -3   CT's Completed:   CT Head, CT Maxillofacial, CT C-Spine, CT Chest w/ contrast, and CT abdomen/pelvis w/ contrast   Interventions:  Port cxr CT scans admit  Plan for disposition:  Admission to ICU   Consults completed:  Neurosurgeon at 1740 - Dr. Franky Macho was in the ED at the time of this pt arrival- was able to examine pt prior to intubation.  Event Summary:  See TMD/ EDP notes  No family has arrived at the time of admission   Hewitt Shorts  Trauma Response RN  Please call TRN at [phone] for further assistance.

## 2023-09-24 ENCOUNTER — Inpatient Hospital Stay (HOSPITAL_COMMUNITY): Payer: MEDICAID

## 2023-09-24 DIAGNOSIS — S065XAA Traumatic subdural hemorrhage with loss of consciousness status unknown, initial encounter: Secondary | ICD-10-CM

## 2023-09-24 DIAGNOSIS — D62 Acute posthemorrhagic anemia: Secondary | ICD-10-CM

## 2023-09-24 DIAGNOSIS — W19XXXA Unspecified fall, initial encounter: Secondary | ICD-10-CM | POA: Diagnosis not present

## 2023-09-24 DIAGNOSIS — J9601 Acute respiratory failure with hypoxia: Secondary | ICD-10-CM

## 2023-09-24 LAB — CBC
HCT: 30.9 % — ABNORMAL LOW (ref 36.0–46.0)
Hemoglobin: 11 g/dL — ABNORMAL LOW (ref 12.0–15.0)
MCH: 33.5 pg (ref 26.0–34.0)
MCHC: 35.6 g/dL (ref 30.0–36.0)
MCV: 94.2 fL (ref 80.0–100.0)
Platelets: 169 10*3/uL (ref 150–400)
RBC: 3.28 MIL/uL — ABNORMAL LOW (ref 3.87–5.11)
RDW: 13.1 % (ref 11.5–15.5)
WBC: 17.3 10*3/uL — ABNORMAL HIGH (ref 4.0–10.5)
nRBC: 0 % (ref 0.0–0.2)

## 2023-09-24 LAB — POCT I-STAT 7, (LYTES, BLD GAS, ICA,H+H)
Acid-base deficit: 2 mmol/L (ref 0.0–2.0)
Bicarbonate: 22.2 mmol/L (ref 20.0–28.0)
Calcium, Ion: 1.17 mmol/L (ref 1.15–1.40)
HCT: 34 % — ABNORMAL LOW (ref 36.0–46.0)
Hemoglobin: 11.6 g/dL — ABNORMAL LOW (ref 12.0–15.0)
O2 Saturation: 99 %
Patient temperature: 99.4
Potassium: 3.7 mmol/L (ref 3.5–5.1)
Sodium: 133 mmol/L — ABNORMAL LOW (ref 135–145)
TCO2: 23 mmol/L (ref 22–32)
pCO2 arterial: 34.5 mm[Hg] (ref 32–48)
pH, Arterial: 7.417 (ref 7.35–7.45)
pO2, Arterial: 160 mm[Hg] — ABNORMAL HIGH (ref 83–108)

## 2023-09-24 LAB — TRIGLYCERIDES: Triglycerides: 47 mg/dL (ref <150)

## 2023-09-24 LAB — HIV ANTIBODY (ROUTINE TESTING W REFLEX): HIV Screen 4th Generation wRfx: NONREACTIVE

## 2023-09-24 LAB — BASIC METABOLIC PANEL
Anion gap: 11 (ref 5–15)
BUN: 8 mg/dL (ref 8–23)
CO2: 22 mmol/L (ref 22–32)
Calcium: 8.6 mg/dL — ABNORMAL LOW (ref 8.9–10.3)
Chloride: 101 mmol/L (ref 98–111)
Creatinine, Ser: 0.85 mg/dL (ref 0.44–1.00)
GFR, Estimated: >60 mL/min (ref >60)
Glucose, Bld: 113 mg/dL — ABNORMAL HIGH (ref 70–99)
Potassium: 3.9 mmol/L (ref 3.5–5.1)
Sodium: 134 mmol/L — ABNORMAL LOW (ref 135–145)

## 2023-09-24 LAB — URINALYSIS, ROUTINE W REFLEX MICROSCOPIC
Bilirubin Urine: NEGATIVE
Glucose, UA: NEGATIVE mg/dL
Ketones, ur: NEGATIVE mg/dL
Nitrite: NEGATIVE
Protein, ur: 30 mg/dL — AB
Specific Gravity, Urine: 1.043 — ABNORMAL HIGH (ref 1.005–1.030)
pH: 5 (ref 5.0–8.0)

## 2023-09-24 LAB — GLUCOSE, CAPILLARY
Glucose-Capillary: 122 mg/dL — ABNORMAL HIGH (ref 70–99)
Glucose-Capillary: 112 mg/dL — ABNORMAL HIGH (ref 70–99)
Glucose-Capillary: 90 mg/dL (ref 70–99)
Glucose-Capillary: 96 mg/dL (ref 70–99)
Glucose-Capillary: 97 mg/dL (ref 70–99)
Glucose-Capillary: 100 mg/dL — ABNORMAL HIGH (ref 70–99)

## 2023-09-24 LAB — PROTIME-INR
INR: 1.1 (ref 0.8–1.2)
Prothrombin Time: 14.1 s (ref 11.4–15.2)

## 2023-09-24 LAB — APTT: aPTT: 30 s (ref 24–36)

## 2023-09-24 LAB — HEMOGLOBIN A1C
Hgb A1c MFr Bld: 5.3 % (ref 4.8–5.6)
Mean Plasma Glucose: 105.41 mg/dL

## 2023-09-24 LAB — LACTIC ACID, PLASMA: Lactic Acid, Venous: 1 mmol/L (ref 0.5–1.9)

## 2023-09-24 LAB — MAGNESIUM: Magnesium: 1.5 mg/dL — ABNORMAL LOW (ref 1.7–2.4)

## 2023-09-24 MED ORDER — FENTANYL CITRATE PF 50 MCG/ML IJ SOSY
50.0000 ug | PREFILLED_SYRINGE | Freq: Once | INTRAMUSCULAR | Status: AC
Start: 2023-09-24 — End: 2023-09-24
  Administered 2023-09-24: 50 ug via INTRAVENOUS

## 2023-09-24 MED ORDER — MAGNESIUM SULFATE 4 GM/100ML IV SOLN
4.0000 g | Freq: Once | INTRAVENOUS | Status: AC
Start: 2023-09-24 — End: 2023-09-24
  Administered 2023-09-24: 4 g via INTRAVENOUS
  Filled 2023-09-24: qty 100

## 2023-09-24 MED ORDER — FENTANYL 2500MCG IN NS 250ML (10MCG/ML) PREMIX INFUSION
INTRAVENOUS | Status: AC
  Filled 2023-09-24: qty 250

## 2023-09-24 MED ORDER — DOCUSATE SODIUM 50 MG/5ML PO LIQD
100.0000 mg | Freq: Two times a day (BID) | ORAL | Status: DC
  Administered 2023-09-24 – 2023-09-27 (×7): 100 mg
  Filled 2023-09-24 (×7): qty 10

## 2023-09-24 MED ORDER — POLYETHYLENE GLYCOL 3350 17 G PO PACK
17.0000 g | PACK | Freq: Every day | ORAL | Status: DC
  Administered 2023-09-24 – 2023-09-27 (×4): 17 g
  Filled 2023-09-24 (×4): qty 1

## 2023-09-24 MED ORDER — FENTANYL 2500MCG IN NS 250ML (10MCG/ML) PREMIX INFUSION
50.0000 ug/h | INTRAVENOUS | Status: DC
  Administered 2023-09-24 (×2): 100 ug/h via INTRAVENOUS
  Administered 2023-09-24: 50 ug/h via INTRAVENOUS
  Administered 2023-09-25 (×2): 100 ug/h via INTRAVENOUS
  Administered 2023-09-26 (×2): 175 ug/h via INTRAVENOUS
  Filled 2023-09-24 (×6): qty 250

## 2023-09-24 MED ORDER — FENTANYL BOLUS VIA INFUSION
50.0000 ug | INTRAVENOUS | Status: DC | PRN
  Administered 2023-09-24: 50 ug via INTRAVENOUS
  Administered 2023-09-26 (×3): 100 ug via INTRAVENOUS
  Administered 2023-09-26 (×2): 50 ug via INTRAVENOUS

## 2023-09-24 NOTE — Progress Notes (Signed)
 [facility] ADULT ICU REPLACEMENT PROTOCOL   The patient does apply for the [facility] Adult ICU Electrolyte Replacment Protocol based on the criteria listed below:   1.Exclusion criteria: TCTS, ECMO, Dialysis, and Myasthenia Gravis patients 2. Is GFR >/= 30 ml/min? Yes.    Patient's GFR today is >60 3. Is SCr </= 2? Yes.   Patient's SCr is 0.85 mg/dL 4. Did SCr increase >/= 0.5 in 24 hours? No. 5.Pt's weight >40kg  Yes.   6. Abnormal electrolyte(s): Mag 1.5  7. Electrolytes replaced per protocol 8.  Call MD STAT for K+ </= 2.5, Phos </= 1, or Mag </= 1 Physician:  Dr. Loralyn Freshwater, Lilia Argue 09/24/2023 5:27 AM

## 2023-09-24 NOTE — IPAL (Signed)
  Interdisciplinary Goals of Care Family Meeting   Date carried out: 09/24/2023  Location of the meeting: Phone conference  Member's involved: Physician and Family Member or next of kin  Durable Power of Attorney or acting medical decision maker: Delma Post, patient sister.     Discussion: We discussed goals of care for [facility] .  Explained that patient had suffered a large subdural hematoma with brain compression and that survival to a good functional state is not expected.  The patient is likely to pass away in hospital and if she does survive the initial insult she will not recover to a good functional status and will be dependent for all her care. Family has agreed to DNR and will discuss amongst themselves comfort care.  Code status:   Code Status: Limited: Do not attempt resuscitation (DNR) -DNR-LIMITED -Do Not Intubate/DNI    Disposition: In-patient comfort care pending family discussion.  Time spent for the meeting: 10 minutes    Lynnell Catalan, MD  09/24/2023, 5:13 PM

## 2023-09-24 NOTE — TOC CM/SW Note (Signed)
 Transition of Care [facility]) - Inpatient Brief Assessment   Patient Details  Name: Heather Padilla MRN: [id number] Date of Birth: [birth year]  Transition of Care [facility]) CM/SW Contact:    Glennon Mac, RN Phone Number: 09/24/2023, 5:15 PM   Clinical Narrative: Patient found down with large left SDH with brain compression. She is intubated with poor/grim prognosis, per MD notes. Attempts being made to reach out to family for GOC.    Transition of Care Asessment: Insurance and Status: Selfpay Patient has primary care physician: No Home environment has been reviewed: LIves with sig other Prior level of function:: Independent, ETOH/PSA Prior/Current Home Services: No current home services Social Drivers of Health Review: SDOH reviewed needs interventions Readmission risk has been reviewed: Yes Transition of care needs: transition of care needs identified, TOC will continue to follow  Quintella Baton, RN, BSN  Trauma/Neuro ICU Case Manager [phone]

## 2023-09-24 NOTE — TOC CAGE-AID Note (Signed)
 Transition of Care [facility]) - CAGE-AID Screening   Patient Details  Name: MILLIANI HERRADA MRN: [id number] Date of Birth: May 19, 1961    Judie Bonus, RN Phone Number: 09/24/2023, 6:11 AM   Clinical Narrative: Pt is intubated and sedated, unable to participate.    CAGE-AID Screening: Substance Abuse Screening unable to be completed due to: : Patient unable to participate

## 2023-09-24 NOTE — Progress Notes (Signed)
 Case d/w Dr. Franky Macho and Dr. Denese Killings, trauma to sign off.  Diamantina Monks, MD General and Trauma Surgery [facility]

## 2023-09-24 NOTE — Progress Notes (Signed)
 Patient ID: Heather Padilla, female   DOB: [birth year], 62 y.o.   MRN: [id number] BP 118/64   Pulse 93   Temp 99.3 F (37.4 C) (Axillary)   Resp 15   Ht 5\' 7"  (1.702 m)   Wt 62.6 kg   SpO2 100%   BMI 21.62 kg/m  Comatose. +oculocephalics, + cough No movement lower, or upper extremities. Prognosis remains grave.  Trying to contact sister. Full code for now

## 2023-09-24 NOTE — Progress Notes (Addendum)
 NAME:  Heather Padilla, MRN:  [id number], DOB:  1961/05/28, LOS: 1 ADMISSION DATE:  09/23/2023, CONSULTATION DATE:  09/23/2023 REFERRING MD:  Franky Macho - NSGY, CHIEF COMPLAINT: Subdural hematoma   History of Present Illness:  63 year old female with no documented past medical history who presented to the emergency department 2/2 evening as a level 1 trauma after a fall last night and found unresponsive this morning.  Husband states that this morning she seemed to be sleeping and when he checked on her at 4 in the afternoon she was unresponsive.  With EMS noted to have blown left pupil, possible decorticate posturing, hypertensive with systolics in the 200s.  Patient was intubated immediately upon arrival to the ED.  She was noted to have a large subdural hematoma over the left cerebral convexity with 1.7 cm of midline shift and evidence of herniation.  Labs notable for Na 132, K 3.4, bicarb 17, AG 17, WBC 20.7, i-STAT lactic 3.7, ABG with pH 7.33, pCO2 37, pO2 525.  She was evaluated by neurosurgery shortly after her arrival.  Given very poor clinical exam, did not feel that operative intervention was appropriate.  Patient was admitted to ICU.  CCM consulted for vent management.  Pertinent Medical History:  None documented  Significant Hospital Events: Including procedures, antibiotic start and stop dates in addition to other pertinent events   2/2 -  ED as a level 1 trauma, unresponsive, intubated, CT head with large subdural and uncal herniation.  Admitted to ICU.  Interim History / Subjective:  Admitted overnight as level 1 trauma Found to have large SDH + early herniation NSGY consulted, no interventions available Unfortunately poor prognosis Working with family on GOC discussions, recommend DNR code status versus transition to comfort-focused care  Objective:  Blood pressure 114/67, pulse 98, temperature 99.2 F (37.3 C), temperature source Axillary, resp. rate 15, height 5\' 7"   (1.702 m), weight 62.6 kg, SpO2 99%.    Vent Mode: PRVC FiO2 (%):  [40 %-100 %] 40 % Set Rate:  [15 bmp] 15 bmp Vt Set:  [490 mL] 490 mL PEEP:  [5 cmH20] 5 cmH20 Plateau Pressure:  [15 cmH20-18 cmH20] 15 cmH20   Intake/Output Summary (Last 24 hours) at 09/24/2023 0942 Last data filed at 09/24/2023 0700 Gross per 24 hour  Intake 608.71 ml  Output 1530 ml  Net -921.29 ml   Filed Weights   09/23/23 1931 09/24/23 0500  Weight: 62.6 kg 62.6 kg   Physical Examination: General: Acutely ill-appearing middle-aged woman in NAD. HEENT: Normocephalic, +periorbital ecchymosis of L eye, anicteric sclera, pupils round, L pupil 4mm nonreactive, R pupil 2mm minimally reactive/sluggish, moist mucous membranes. Neuro:  Intubated and lightly sedated.  Does not respond to verbal, tactile or noxious stimuli. Not following commands. No spontaneous movement of extremities. +Corneal, +Cough, and +Gag  CV: RRR, no m/g/r. PULM: Breathing even and unlabored on vent (PEEP 5 , FiO2 40%). Lung fields CTAB. GI: Soft, nontender, nondistended. Normoactive bowel sounds. Extremities: No significant LE edema noted. Skin: Warm/dry, no rashes. L periorbital ecchymosis as described above.  Resolved Hospital Problem List:    Assessment & Plan:  Acute left subdural hematoma Uncal herniation  CT head with large SDH over left cerebral convexity with mass effect and herniation. 1.7cm rightward midline shift. Poor neuro exam with left pupil fixed and dilated, does not withdraw to stimulus. +gag on suction. Neurosurgery declining operative intervention at this time.  - Poor prognosis, given size/location of SDH and poor neurologic exam -  NSGY primary, appreciate management - No surgical intervention warranted at this time - NS at present; HTS or mannitol per NSGY (if indicated) - Frequent neurochecks - Neuroprotective measures: HOB > 30 degrees, normoglycemia, normothermia, electrolytes WNL - Ongoing GOC discussions per  primary team; this is unfortunately felt to be a nonsurvivable injury. NOK is patient's biological sister, attempting to contact for further discussion of patient's wishes  Acute hypoxic respiratory failure 2/2 above  Intubated on arrival to ED.  - Continue full vent support (4-8cc/kg IBW) - Wean FiO2 for O2 sat > 90% - WUA/SBT not appropriate at this time due to poor mental status/neuro exam - VAP bundle - Pulmonary hygiene - PAD protocol for sedation: Fentanyl for goal RASS 0 to -1  Leukocytosis Likely reactive. - Trend WBC, fever curve  AGMA  Lactic acidosis, resolved Unclear amount of downtime prior to being found unresponsive.  - Trend BMP - Replete electrolytes as indicated - Monitor I&Os - LA cleared/normalized  Goals of Care: Significant other at bedside has been informed by Dr. Franky Macho with NSGY that this is a non-survivable injury. Multiple attempts by significant other being made to contact patient's biological sister to help in medical decision making. Given herniation syndrome, patient may become more unstable prior to getting in contact with sister and will require possible further decision making at that time. SO verbalizes understanding. - Recommend DNR code status - Ongoing GOC discussions - Would be reasonable to transition to comfort-focused care, given severity of injury  Best Practice: (right click and "Reselect all SmartList Selections" daily)   Diet/type: NPO DVT prophylaxis: SCD Pressure ulcer(s): na GI prophylaxis: H2B Lines: N/A Foley:  Yes, and it is still needed Code Status:  full code Last date of multidisciplinary goals of care discussion [pending]  Critical care time:   The patient is critically ill with multiple organ system failure and requires high complexity decision making for assessment and support, frequent evaluation and titration of therapies, advanced monitoring, review of radiographic studies and interpretation of complex data.    Critical Care Time devoted to patient care services, exclusive of separately billable procedures, described in this note is 39 minutes.  Tim Lair, PA-C [facility] 09/24/23 9:42 AM  Please see [email or web address] for pager details.  From 7A-7P if no response, please call [phone] After hours, please call ELink [phone]

## 2023-09-25 ENCOUNTER — Other Ambulatory Visit (HOSPITAL_COMMUNITY): Payer: MEDICAID

## 2023-09-25 DIAGNOSIS — S065XAA Traumatic subdural hemorrhage with loss of consciousness status unknown, initial encounter: Secondary | ICD-10-CM | POA: Diagnosis not present

## 2023-09-25 DIAGNOSIS — G931 Anoxic brain damage, not elsewhere classified: Secondary | ICD-10-CM | POA: Diagnosis not present

## 2023-09-25 DIAGNOSIS — J9601 Acute respiratory failure with hypoxia: Secondary | ICD-10-CM | POA: Diagnosis not present

## 2023-09-25 LAB — CBC WITH DIFFERENTIAL/PLATELET
Abs Immature Granulocytes: 0.12 10*3/uL — ABNORMAL HIGH (ref 0.00–0.07)
Abs Immature Granulocytes: 0.09 10*3/uL — ABNORMAL HIGH (ref 0.00–0.07)
Basophils Absolute: 0 10*3/uL (ref 0.0–0.1)
Basophils Absolute: 0 10*3/uL (ref 0.0–0.1)
Basophils Relative: 0 %
Basophils Relative: 0 %
Eosinophils Absolute: 0.1 10*3/uL (ref 0.0–0.5)
Eosinophils Absolute: 0.1 10*3/uL (ref 0.0–0.5)
Eosinophils Relative: 1 %
Eosinophils Relative: 1 %
HCT: 29.5 % — ABNORMAL LOW (ref 36.0–46.0)
HCT: 29.1 % — ABNORMAL LOW (ref 36.0–46.0)
Hemoglobin: 9.9 g/dL — ABNORMAL LOW (ref 12.0–15.0)
Hemoglobin: 9.5 g/dL — ABNORMAL LOW (ref 12.0–15.0)
Immature Granulocytes: 1 %
Immature Granulocytes: 1 %
Lymphocytes Relative: 7 %
Lymphocytes Relative: 6 %
Lymphs Abs: 1 10*3/uL (ref 0.7–4.0)
Lymphs Abs: 0.8 10*3/uL (ref 0.7–4.0)
MCH: 33.3 pg (ref 26.0–34.0)
MCH: 33.1 pg (ref 26.0–34.0)
MCHC: 33.6 g/dL (ref 30.0–36.0)
MCHC: 32.6 g/dL (ref 30.0–36.0)
MCV: 99.3 fL (ref 80.0–100.0)
MCV: 101.4 fL — ABNORMAL HIGH (ref 80.0–100.0)
Monocytes Absolute: 1.2 10*3/uL — ABNORMAL HIGH (ref 0.1–1.0)
Monocytes Absolute: 1.1 10*3/uL — ABNORMAL HIGH (ref 0.1–1.0)
Monocytes Relative: 9 %
Monocytes Relative: 8 %
Neutro Abs: 11.3 10*3/uL — ABNORMAL HIGH (ref 1.7–7.7)
Neutro Abs: 11.2 10*3/uL — ABNORMAL HIGH (ref 1.7–7.7)
Neutrophils Relative %: 82 %
Neutrophils Relative %: 84 %
Platelets: 164 10*3/uL (ref 150–400)
Platelets: 165 10*3/uL (ref 150–400)
RBC: 2.97 MIL/uL — ABNORMAL LOW (ref 3.87–5.11)
RBC: 2.87 MIL/uL — ABNORMAL LOW (ref 3.87–5.11)
RDW: 13.5 % (ref 11.5–15.5)
RDW: 13.3 % (ref 11.5–15.5)
WBC: 13.7 10*3/uL — ABNORMAL HIGH (ref 4.0–10.5)
WBC: 13.3 10*3/uL — ABNORMAL HIGH (ref 4.0–10.5)
nRBC: 0 % (ref 0.0–0.2)
nRBC: 0 % (ref 0.0–0.2)

## 2023-09-25 LAB — URINALYSIS, ROUTINE W REFLEX MICROSCOPIC
Bilirubin Urine: NEGATIVE
Glucose, UA: NEGATIVE mg/dL
Hgb urine dipstick: NEGATIVE
Ketones, ur: 20 mg/dL — AB
Leukocytes,Ua: NEGATIVE
Nitrite: NEGATIVE
Protein, ur: NEGATIVE mg/dL
Specific Gravity, Urine: 1.014 (ref 1.005–1.030)
pH: 5 (ref 5.0–8.0)

## 2023-09-25 LAB — COMPREHENSIVE METABOLIC PANEL
ALT: 19 U/L (ref 0–44)
ALT: 19 U/L (ref 0–44)
AST: 35 U/L (ref 15–41)
AST: 30 U/L (ref 15–41)
Albumin: 3 g/dL — ABNORMAL LOW (ref 3.5–5.0)
Albumin: 2.8 g/dL — ABNORMAL LOW (ref 3.5–5.0)
Alkaline Phosphatase: 49 U/L (ref 38–126)
Alkaline Phosphatase: 48 U/L (ref 38–126)
Anion gap: 11 (ref 5–15)
Anion gap: 10 (ref 5–15)
BUN: 12 mg/dL (ref 8–23)
BUN: 11 mg/dL (ref 8–23)
CO2: 21 mmol/L — ABNORMAL LOW (ref 22–32)
CO2: 21 mmol/L — ABNORMAL LOW (ref 22–32)
Calcium: 8.7 mg/dL — ABNORMAL LOW (ref 8.9–10.3)
Calcium: 8.5 mg/dL — ABNORMAL LOW (ref 8.9–10.3)
Chloride: 105 mmol/L (ref 98–111)
Chloride: 106 mmol/L (ref 98–111)
Creatinine, Ser: 0.68 mg/dL (ref 0.44–1.00)
Creatinine, Ser: 0.72 mg/dL (ref 0.44–1.00)
GFR, Estimated: >60 mL/min (ref >60)
GFR, Estimated: >60 mL/min (ref >60)
Glucose, Bld: 106 mg/dL — ABNORMAL HIGH (ref 70–99)
Glucose, Bld: 132 mg/dL — ABNORMAL HIGH (ref 70–99)
Potassium: 3.8 mmol/L (ref 3.5–5.1)
Potassium: 4 mmol/L (ref 3.5–5.1)
Sodium: 137 mmol/L (ref 135–145)
Sodium: 137 mmol/L (ref 135–145)
Total Bilirubin: 0.8 mg/dL (ref 0.0–1.2)
Total Bilirubin: 0.7 mg/dL (ref 0.0–1.2)
Total Protein: 6.2 g/dL — ABNORMAL LOW (ref 6.5–8.1)
Total Protein: 5.8 g/dL — ABNORMAL LOW (ref 6.5–8.1)

## 2023-09-25 LAB — GLUCOSE, CAPILLARY
Glucose-Capillary: 102 mg/dL — ABNORMAL HIGH (ref 70–99)
Glucose-Capillary: 82 mg/dL (ref 70–99)
Glucose-Capillary: 78 mg/dL (ref 70–99)
Glucose-Capillary: 98 mg/dL (ref 70–99)
Glucose-Capillary: 106 mg/dL — ABNORMAL HIGH (ref 70–99)
Glucose-Capillary: 119 mg/dL — ABNORMAL HIGH (ref 70–99)

## 2023-09-25 LAB — PROTIME-INR
INR: 1 (ref 0.8–1.2)
INR: 1 (ref 0.8–1.2)
Prothrombin Time: 13.3 s (ref 11.4–15.2)
Prothrombin Time: 13.2 s (ref 11.4–15.2)

## 2023-09-25 LAB — BILIRUBIN, DIRECT
Bilirubin, Direct: 0.2 mg/dL (ref 0.0–0.2)
Bilirubin, Direct: 0.1 mg/dL (ref 0.0–0.2)

## 2023-09-25 LAB — ABO/RH
ABO/RH(D): A POS
PT AG Type: POSITIVE

## 2023-09-25 LAB — CG4 I-STAT (LACTIC ACID): Lactic Acid, Venous: 0.5 mmol/L (ref 0.5–1.9)

## 2023-09-25 LAB — APTT: aPTT: 32 s (ref 24–36)

## 2023-09-25 LAB — SARS CORONAVIRUS 2 BY RT PCR: SARS Coronavirus 2 by RT PCR: NEGATIVE

## 2023-09-25 MED ORDER — HYDRALAZINE HCL 20 MG/ML IJ SOLN
5.0000 mg | INTRAMUSCULAR | Status: AC | PRN
  Administered 2023-09-26 (×2): 5 mg via INTRAVENOUS
  Filled 2023-09-25: qty 1

## 2023-09-25 MED ORDER — SODIUM CHLORIDE 0.9 % IV SOLN: INTRAVENOUS | Status: AC | PRN

## 2023-09-25 MED ORDER — LABETALOL HCL 5 MG/ML IV SOLN
10.0000 mg | INTRAVENOUS | Status: AC | PRN
  Administered 2023-09-25 – 2023-09-26 (×4): 10 mg via INTRAVENOUS
  Filled 2023-09-25 (×3): qty 4

## 2023-09-25 MED ORDER — ACETAMINOPHEN 160 MG/5ML PO SOLN: 650.0000 mg | Freq: Four times a day (QID) | ORAL | Status: DC | PRN

## 2023-09-25 MED ORDER — ACETAMINOPHEN 650 MG RE SUPP: 650.0000 mg | Freq: Four times a day (QID) | RECTAL | Status: DC | PRN

## 2023-09-25 MED ORDER — LACTATED RINGERS IV SOLN: INTRAVENOUS | Status: AC

## 2023-09-25 NOTE — Progress Notes (Signed)
 Patient ID: Heather Padilla, female   DOB: [birth year], 63 y.o.   MRN: [id number] Patient has been designated a donor.

## 2023-09-25 NOTE — Procedures (Signed)
 Arterial Line Insertion Start/End2/11/2023 4:40 PM, 09/25/2023 5:00 PM  Patient location: ICU. Preanesthetic checklist: patient identified, risks and benefits discussed, surgical consent, monitors and equipment checked and timeout performed Right, radial was placed Catheter size: 20 G Hand hygiene performed  and maximum sterile barriers used  Allen's test indicative of satisfactory collateral circulation Attempts: 1 Procedure performed without using ultrasound guided technique. Following insertion, dressing applied (Tegaderm CHG dressing, gel pad). Post procedure assessment: normal  Patient tolerated the procedure well with no immediate complications. Additional procedure comments: Arterial line placed per order by 2 RT's with no complications. SABRA

## 2023-09-25 NOTE — Progress Notes (Signed)
 eLink Physician-Brief Progress Note Patient Name: Heather Padilla DOB: [birth year] MRN: [id number]   Date of Service  09/25/2023  HPI/Events of Note  63 year old female who initially presented as a level 1 trauma after a fall found to have anisocoria and imaging consistent with subdural hematoma. Given poor clinical exam, neurosurgical intervention was deferred.   Foley in place, less than 100 cc of urine since beginning of shift.  Net -440 cc for the stay  eICU Interventions  Start LR infusion for 1 L     Intervention Category Intermediate Interventions: Oliguria - evaluation and management  Sahasra Belue 09/25/2023, 1:43 AM

## 2023-09-25 NOTE — Progress Notes (Addendum)
 NAME:  Heather Padilla, MRN:  [id number], DOB:  02-16-1961, LOS: 2 ADMISSION DATE:  09/23/2023, CONSULTATION DATE:  09/23/2023 REFERRING MD:  Gillie - NSGY, CHIEF COMPLAINT: Subdural hematoma   History of Present Illness:  63 year old female with no documented past medical history who presented to the emergency department 2/2 evening as a level 1 trauma after a fall last night and found unresponsive this morning.  Husband states that this morning she seemed to be sleeping and when he checked on her at 4 in the afternoon she was unresponsive.  With EMS noted to have blown left pupil, possible decorticate posturing, hypertensive with systolics in the 200s.  Patient was intubated immediately upon arrival to the ED.  She was noted to have a large subdural hematoma over the left cerebral convexity with 1.7 cm of midline shift and evidence of herniation.  Labs notable for Na 132, K 3.4, bicarb 17, AG 17, WBC 20.7, i-STAT lactic 3.7, ABG with pH 7.33, pCO2 37, pO2 525.  She was evaluated by neurosurgery shortly after her arrival.  Given very poor clinical exam, did not feel that operative intervention was appropriate.  Patient was admitted to ICU.  CCM consulted for vent management.  Pertinent Medical History:  None documented  Significant Hospital Events: Including procedures, antibiotic start and stop dates in addition to other pertinent events   2/2 -  ED as a level 1 trauma, unresponsive, intubated, CT head with large subdural and uncal herniation.  Admitted to ICU.  Interim History / Subjective:  Spoke to family yesterday.  Her sister agrees with a transition to comfort care.  She is on her way from [place].  Objective:  Blood pressure 138/69, pulse 100, temperature 99 F (37.2 C), temperature source Axillary, resp. rate 15, height 5' 7 (1.702 m), weight 62 kg, SpO2 99%.    Vent Mode: PRVC FiO2 (%):  [40 %] 40 % Set Rate:  [15 bmp] 15 bmp Vt Set:  [440 mL-490 mL] 490 mL PEEP:  [5  cmH20] 5 cmH20 Plateau Pressure:  [13 cmH20-15 cmH20] 15 cmH20   Intake/Output Summary (Last 24 hours) at 09/25/2023 1316 Last data filed at 09/25/2023 1200 Gross per 24 hour  Intake 1568.56 ml  Output 510 ml  Net 1058.56 ml   Filed Weights   09/23/23 1931 09/24/23 0500 09/25/23 0456  Weight: 62.6 kg 62.6 kg 62 kg   Physical Examination: General: Acutely ill-appearing middle-aged woman in NAD. HEENT: Periorbital ecchymosis. Neuro: Eyes closed no response to pain no response to verbal stim elation.  Pinpoint pupils.  Corneals, gag, cough and respiratory drive are present. CV: Heart sounds are unremarkable. PULM: Chest clear to auscultation bilaterally GI: Soft Extremities: No significant LE edema noted. Skin: Warm/dry, no rashes. L periorbital ecchymosis as described above.  Ancillary tests personally reviewed:  No new investigations.  Assessment & Plan:  Acute left subdural hematoma with brain compression Uncal herniation  Acute hypoxic respiratory failure 2/2 above   Plan:  -Large subdural has resulted in catastrophic neurological injury.  Survival is unlikely and there is no realistic possibility of recovery anything more than a highly dependent state. -Family plans on transitioning to comfort care and compassionate extubation once they have had a chance to visit.   Best Practice: (right click and Reselect all SmartList Selections daily)   Diet/type: NPO DVT prophylaxis: SCD Pressure ulcer(s): na GI prophylaxis: H2B Lines: N/A Foley:  Yes, and it is still needed Code Status:  full code Last date of  multidisciplinary goals of care discussion [pending]  Critical care time:   The patient is critically ill with multiple organ system failure and requires high complexity decision making for assessment and support, frequent evaluation and titration of therapies, advanced monitoring, review of radiographic studies and interpretation of complex data.   Critical Care Time  devoted to patient care services, exclusive of separately billable procedures, described in this note is 35 minutes.  Fredia Alderton, MD [facility] 09/25/23 1:16 PM  Please see [email or web address] for pager details.  From 7A-7P if no response, please call [phone] After hours, please call ELink [phone]

## 2023-09-26 ENCOUNTER — Encounter (HOSPITAL_COMMUNITY): Payer: Self-pay | Admitting: Certified Registered Nurse Anesthetist

## 2023-09-26 ENCOUNTER — Encounter (HOSPITAL_COMMUNITY): Admission: EM | Disposition: E | Payer: Self-pay | Source: Home / Self Care | Attending: Neurosurgery

## 2023-09-26 ENCOUNTER — Other Ambulatory Visit (HOSPITAL_COMMUNITY): Payer: MEDICAID

## 2023-09-26 DIAGNOSIS — Z529 Donor of unspecified organ or tissue: Secondary | ICD-10-CM

## 2023-09-26 LAB — POCT I-STAT 7, (LYTES, BLD GAS, ICA,H+H)
Acid-base deficit: 2 mmol/L (ref 0.0–2.0)
Acid-base deficit: 1 mmol/L (ref 0.0–2.0)
Acid-base deficit: 1 mmol/L (ref 0.0–2.0)
Acid-base deficit: 1 mmol/L (ref 0.0–2.0)
Bicarbonate: 23.4 mmol/L (ref 20.0–28.0)
Bicarbonate: 24.7 mmol/L (ref 20.0–28.0)
Bicarbonate: 24.5 mmol/L (ref 20.0–28.0)
Bicarbonate: 24.8 mmol/L (ref 20.0–28.0)
Calcium, Ion: 1.21 mmol/L (ref 1.15–1.40)
Calcium, Ion: 1.23 mmol/L (ref 1.15–1.40)
Calcium, Ion: 1.24 mmol/L (ref 1.15–1.40)
Calcium, Ion: 1.23 mmol/L (ref 1.15–1.40)
HCT: 27 % — ABNORMAL LOW (ref 36.0–46.0)
HCT: 29 % — ABNORMAL LOW (ref 36.0–46.0)
HCT: 28 % — ABNORMAL LOW (ref 36.0–46.0)
HCT: 25 % — ABNORMAL LOW (ref 36.0–46.0)
Hemoglobin: 9.2 g/dL — ABNORMAL LOW (ref 12.0–15.0)
Hemoglobin: 9.9 g/dL — ABNORMAL LOW (ref 12.0–15.0)
Hemoglobin: 9.5 g/dL — ABNORMAL LOW (ref 12.0–15.0)
Hemoglobin: 8.5 g/dL — ABNORMAL LOW (ref 12.0–15.0)
O2 Saturation: 100 %
O2 Saturation: 100 %
O2 Saturation: 100 %
O2 Saturation: 100 %
Patient temperature: 37.4
Patient temperature: 36.8
Patient temperature: 37.2
Patient temperature: 37.3
Potassium: 3.9 mmol/L (ref 3.5–5.1)
Potassium: 4.1 mmol/L (ref 3.5–5.1)
Potassium: 4.1 mmol/L (ref 3.5–5.1)
Potassium: 3.9 mmol/L (ref 3.5–5.1)
Sodium: 138 mmol/L (ref 135–145)
Sodium: 138 mmol/L (ref 135–145)
Sodium: 138 mmol/L (ref 135–145)
Sodium: 139 mmol/L (ref 135–145)
TCO2: 25 mmol/L (ref 22–32)
TCO2: 26 mmol/L (ref 22–32)
TCO2: 26 mmol/L (ref 22–32)
TCO2: 26 mmol/L (ref 22–32)
pCO2 arterial: 43.9 mm[Hg] (ref 32–48)
pCO2 arterial: 45.8 mm[Hg] (ref 32–48)
pCO2 arterial: 43.5 mm[Hg] (ref 32–48)
pCO2 arterial: 44.1 mm[Hg] (ref 32–48)
pH, Arterial: 7.336 — ABNORMAL LOW (ref 7.35–7.45)
pH, Arterial: 7.338 — ABNORMAL LOW (ref 7.35–7.45)
pH, Arterial: 7.36 (ref 7.35–7.45)
pH, Arterial: 7.36 (ref 7.35–7.45)
pO2, Arterial: 428 mm[Hg] — ABNORMAL HIGH (ref 83–108)
pO2, Arterial: 525 mm[Hg] — ABNORMAL HIGH (ref 83–108)
pO2, Arterial: 412 mm[Hg] — ABNORMAL HIGH (ref 83–108)
pO2, Arterial: 500 mm[Hg] — ABNORMAL HIGH (ref 83–108)

## 2023-09-26 LAB — PROTIME-INR
INR: 1 (ref 0.8–1.2)
INR: 1.1 (ref 0.8–1.2)
INR: 1 (ref 0.8–1.2)
INR: 1.1 (ref 0.8–1.2)
Prothrombin Time: 13.5 s (ref 11.4–15.2)
Prothrombin Time: 14.1 s (ref 11.4–15.2)
Prothrombin Time: 13.5 s (ref 11.4–15.2)
Prothrombin Time: 14.5 s (ref 11.4–15.2)

## 2023-09-26 LAB — CBC WITH DIFFERENTIAL/PLATELET
Abs Immature Granulocytes: 0.12 10*3/uL — ABNORMAL HIGH (ref 0.00–0.07)
Abs Immature Granulocytes: 0.13 10*3/uL — ABNORMAL HIGH (ref 0.00–0.07)
Abs Immature Granulocytes: 0.1 10*3/uL — ABNORMAL HIGH (ref 0.00–0.07)
Abs Immature Granulocytes: 0.07 10*3/uL (ref 0.00–0.07)
Basophils Absolute: 0.1 10*3/uL (ref 0.0–0.1)
Basophils Absolute: 0 10*3/uL (ref 0.0–0.1)
Basophils Absolute: 0 10*3/uL (ref 0.0–0.1)
Basophils Absolute: 0 10*3/uL (ref 0.0–0.1)
Basophils Relative: 0 %
Basophils Relative: 0 %
Basophils Relative: 0 %
Basophils Relative: 0 %
Eosinophils Absolute: 0.1 10*3/uL (ref 0.0–0.5)
Eosinophils Absolute: 0.1 10*3/uL (ref 0.0–0.5)
Eosinophils Absolute: 0.2 10*3/uL (ref 0.0–0.5)
Eosinophils Absolute: 0.3 10*3/uL (ref 0.0–0.5)
Eosinophils Relative: 1 %
Eosinophils Relative: 1 %
Eosinophils Relative: 2 %
Eosinophils Relative: 3 %
HCT: 30.3 % — ABNORMAL LOW (ref 36.0–46.0)
HCT: 26.5 % — ABNORMAL LOW (ref 36.0–46.0)
HCT: 26.6 % — ABNORMAL LOW (ref 36.0–46.0)
HCT: 24.7 % — ABNORMAL LOW (ref 36.0–46.0)
Hemoglobin: 10.1 g/dL — ABNORMAL LOW (ref 12.0–15.0)
Hemoglobin: 8.6 g/dL — ABNORMAL LOW (ref 12.0–15.0)
Hemoglobin: 8.5 g/dL — ABNORMAL LOW (ref 12.0–15.0)
Hemoglobin: 8.2 g/dL — ABNORMAL LOW (ref 12.0–15.0)
Immature Granulocytes: 1 %
Immature Granulocytes: 1 %
Immature Granulocytes: 1 %
Immature Granulocytes: 1 %
Lymphocytes Relative: 6 %
Lymphocytes Relative: 6 %
Lymphocytes Relative: 9 %
Lymphocytes Relative: 10 %
Lymphs Abs: 1 10*3/uL (ref 0.7–4.0)
Lymphs Abs: 0.8 10*3/uL (ref 0.7–4.0)
Lymphs Abs: 1.1 10*3/uL (ref 0.7–4.0)
Lymphs Abs: 1.1 10*3/uL (ref 0.7–4.0)
MCH: 33.8 pg (ref 26.0–34.0)
MCH: 33.6 pg (ref 26.0–34.0)
MCH: 32.8 pg (ref 26.0–34.0)
MCH: 33.2 pg (ref 26.0–34.0)
MCHC: 33.3 g/dL (ref 30.0–36.0)
MCHC: 32.5 g/dL (ref 30.0–36.0)
MCHC: 32 g/dL (ref 30.0–36.0)
MCHC: 33.2 g/dL (ref 30.0–36.0)
MCV: 101.3 fL — ABNORMAL HIGH (ref 80.0–100.0)
MCV: 103.5 fL — ABNORMAL HIGH (ref 80.0–100.0)
MCV: 102.7 fL — ABNORMAL HIGH (ref 80.0–100.0)
MCV: 100 fL (ref 80.0–100.0)
Monocytes Absolute: 1.5 10*3/uL — ABNORMAL HIGH (ref 0.1–1.0)
Monocytes Absolute: 1.4 10*3/uL — ABNORMAL HIGH (ref 0.1–1.0)
Monocytes Absolute: 1.4 10*3/uL — ABNORMAL HIGH (ref 0.1–1.0)
Monocytes Absolute: 1.2 10*3/uL — ABNORMAL HIGH (ref 0.1–1.0)
Monocytes Relative: 10 %
Monocytes Relative: 10 %
Monocytes Relative: 11 %
Monocytes Relative: 11 %
Neutro Abs: 12.7 10*3/uL — ABNORMAL HIGH (ref 1.7–7.7)
Neutro Abs: 11.2 10*3/uL — ABNORMAL HIGH (ref 1.7–7.7)
Neutro Abs: 9.9 10*3/uL — ABNORMAL HIGH (ref 1.7–7.7)
Neutro Abs: 8.4 10*3/uL — ABNORMAL HIGH (ref 1.7–7.7)
Neutrophils Relative %: 82 %
Neutrophils Relative %: 82 %
Neutrophils Relative %: 77 %
Neutrophils Relative %: 75 %
Platelets: 180 10*3/uL (ref 150–400)
Platelets: 169 10*3/uL (ref 150–400)
Platelets: 168 10*3/uL (ref 150–400)
Platelets: 179 10*3/uL (ref 150–400)
RBC: 2.99 MIL/uL — ABNORMAL LOW (ref 3.87–5.11)
RBC: 2.56 MIL/uL — ABNORMAL LOW (ref 3.87–5.11)
RBC: 2.59 MIL/uL — ABNORMAL LOW (ref 3.87–5.11)
RBC: 2.47 MIL/uL — ABNORMAL LOW (ref 3.87–5.11)
RDW: 13.5 % (ref 11.5–15.5)
RDW: 13.4 % (ref 11.5–15.5)
RDW: 13.6 % (ref 11.5–15.5)
RDW: 13.4 % (ref 11.5–15.5)
WBC: 15.4 10*3/uL — ABNORMAL HIGH (ref 4.0–10.5)
WBC: 13.7 10*3/uL — ABNORMAL HIGH (ref 4.0–10.5)
WBC: 12.7 10*3/uL — ABNORMAL HIGH (ref 4.0–10.5)
WBC: 11.1 10*3/uL — ABNORMAL HIGH (ref 4.0–10.5)
nRBC: 0 % (ref 0.0–0.2)
nRBC: 0 % (ref 0.0–0.2)
nRBC: 0 % (ref 0.0–0.2)
nRBC: 0 % (ref 0.0–0.2)

## 2023-09-26 LAB — BILIRUBIN, DIRECT
Bilirubin, Direct: 0.2 mg/dL (ref 0.0–0.2)
Bilirubin, Direct: 0.1 mg/dL (ref 0.0–0.2)
Bilirubin, Direct: 0.2 mg/dL (ref 0.0–0.2)
Bilirubin, Direct: 0.2 mg/dL (ref 0.0–0.2)

## 2023-09-26 LAB — COMPREHENSIVE METABOLIC PANEL
ALT: 20 U/L (ref 0–44)
ALT: 17 U/L (ref 0–44)
ALT: 17 U/L (ref 0–44)
ALT: 17 U/L (ref 0–44)
AST: 32 U/L (ref 15–41)
AST: 29 U/L (ref 15–41)
AST: 26 U/L (ref 15–41)
AST: 25 U/L (ref 15–41)
Albumin: 3 g/dL — ABNORMAL LOW (ref 3.5–5.0)
Albumin: 2.5 g/dL — ABNORMAL LOW (ref 3.5–5.0)
Albumin: 2.5 g/dL — ABNORMAL LOW (ref 3.5–5.0)
Albumin: 2.3 g/dL — ABNORMAL LOW (ref 3.5–5.0)
Alkaline Phosphatase: 54 U/L (ref 38–126)
Alkaline Phosphatase: 43 U/L (ref 38–126)
Alkaline Phosphatase: 54 U/L (ref 38–126)
Alkaline Phosphatase: 41 U/L (ref 38–126)
Anion gap: 10 (ref 5–15)
Anion gap: 8 (ref 5–15)
Anion gap: 10 (ref 5–15)
Anion gap: 8 (ref 5–15)
BUN: 12 mg/dL (ref 8–23)
BUN: 10 mg/dL (ref 8–23)
BUN: 11 mg/dL (ref 8–23)
BUN: 11 mg/dL (ref 8–23)
CO2: 21 mmol/L — ABNORMAL LOW (ref 22–32)
CO2: 20 mmol/L — ABNORMAL LOW (ref 22–32)
CO2: 22 mmol/L (ref 22–32)
CO2: 23 mmol/L (ref 22–32)
Calcium: 8.8 mg/dL — ABNORMAL LOW (ref 8.9–10.3)
Calcium: 7.6 mg/dL — ABNORMAL LOW (ref 8.9–10.3)
Calcium: 8.2 mg/dL — ABNORMAL LOW (ref 8.9–10.3)
Calcium: 8.4 mg/dL — ABNORMAL LOW (ref 8.9–10.3)
Chloride: 106 mmol/L (ref 98–111)
Chloride: 112 mmol/L — ABNORMAL HIGH (ref 98–111)
Chloride: 108 mmol/L (ref 98–111)
Chloride: 110 mmol/L (ref 98–111)
Creatinine, Ser: 0.59 mg/dL (ref 0.44–1.00)
Creatinine, Ser: 0.53 mg/dL (ref 0.44–1.00)
Creatinine, Ser: 0.59 mg/dL (ref 0.44–1.00)
Creatinine, Ser: 0.71 mg/dL (ref 0.44–1.00)
GFR, Estimated: >60 mL/min (ref >60)
GFR, Estimated: >60 mL/min (ref >60)
GFR, Estimated: >60 mL/min (ref >60)
GFR, Estimated: >60 mL/min (ref >60)
Glucose, Bld: 139 mg/dL — ABNORMAL HIGH (ref 70–99)
Glucose, Bld: 107 mg/dL — ABNORMAL HIGH (ref 70–99)
Glucose, Bld: 114 mg/dL — ABNORMAL HIGH (ref 70–99)
Glucose, Bld: 119 mg/dL — ABNORMAL HIGH (ref 70–99)
Potassium: 4 mmol/L (ref 3.5–5.1)
Potassium: 3.8 mmol/L (ref 3.5–5.1)
Potassium: 3.7 mmol/L (ref 3.5–5.1)
Potassium: 3.6 mmol/L (ref 3.5–5.1)
Sodium: 137 mmol/L (ref 135–145)
Sodium: 140 mmol/L (ref 135–145)
Sodium: 140 mmol/L (ref 135–145)
Sodium: 141 mmol/L (ref 135–145)
Total Bilirubin: 0.8 mg/dL (ref 0.0–1.2)
Total Bilirubin: 0.5 mg/dL (ref 0.0–1.2)
Total Bilirubin: 0.7 mg/dL (ref 0.0–1.2)
Total Bilirubin: 0.9 mg/dL (ref 0.0–1.2)
Total Protein: 6.3 g/dL — ABNORMAL LOW (ref 6.5–8.1)
Total Protein: 5.2 g/dL — ABNORMAL LOW (ref 6.5–8.1)
Total Protein: 5.5 g/dL — ABNORMAL LOW (ref 6.5–8.1)
Total Protein: 5.4 g/dL — ABNORMAL LOW (ref 6.5–8.1)

## 2023-09-26 LAB — ECHOCARDIOGRAM COMPLETE
AR max vel: 1.99 cm2
AV Area VTI: 1.99 cm2
AV Area mean vel: 2.02 cm2
AV Mean grad: 3 mm[Hg]
AV Peak grad: 5.2 mm[Hg]
Ao pk vel: 1.14 m/s
Area-P 1/2: 4.8 cm2
Height: 67 in
S' Lateral: 2.1 cm
Weight: 2190.49 [oz_av]

## 2023-09-26 LAB — URINE CULTURE: Culture: NO GROWTH

## 2023-09-26 LAB — PREPARE RBC (CROSSMATCH)

## 2023-09-26 LAB — URINALYSIS, ROUTINE W REFLEX MICROSCOPIC
Bilirubin Urine: NEGATIVE
Glucose, UA: NEGATIVE mg/dL
Hgb urine dipstick: NEGATIVE
Ketones, ur: NEGATIVE mg/dL
Leukocytes,Ua: NEGATIVE
Nitrite: NEGATIVE
Protein, ur: NEGATIVE mg/dL
Specific Gravity, Urine: 1.031 — ABNORMAL HIGH (ref 1.005–1.030)
pH: 5 (ref 5.0–8.0)

## 2023-09-26 LAB — APTT
aPTT: 31 s (ref 24–36)
aPTT: 30 s (ref 24–36)
aPTT: 28 s (ref 24–36)

## 2023-09-26 LAB — MAGNESIUM
Magnesium: 2.3 mg/dL (ref 1.7–2.4)
Magnesium: 2 mg/dL (ref 1.7–2.4)
Magnesium: 1.9 mg/dL (ref 1.7–2.4)
Magnesium: 2.1 mg/dL (ref 1.7–2.4)
Magnesium: 2 mg/dL (ref 1.7–2.4)

## 2023-09-26 LAB — GLUCOSE, CAPILLARY
Glucose-Capillary: 104 mg/dL — ABNORMAL HIGH (ref 70–99)
Glucose-Capillary: 129 mg/dL — ABNORMAL HIGH (ref 70–99)
Glucose-Capillary: 119 mg/dL — ABNORMAL HIGH (ref 70–99)
Glucose-Capillary: 97 mg/dL (ref 70–99)
Glucose-Capillary: 85 mg/dL (ref 70–99)
Glucose-Capillary: 99 mg/dL (ref 70–99)

## 2023-09-26 LAB — PHOSPHORUS
Phosphorus: 3.9 mg/dL (ref 2.5–4.6)
Phosphorus: 3.5 mg/dL (ref 2.5–4.6)
Phosphorus: 2.9 mg/dL (ref 2.5–4.6)
Phosphorus: 3 mg/dL (ref 2.5–4.6)
Phosphorus: 3.4 mg/dL (ref 2.5–4.6)

## 2023-09-26 LAB — LACTIC ACID, PLASMA: Lactic Acid, Venous: 1.1 mmol/L (ref 0.5–1.9)

## 2023-09-26 SURGERY — SURGICAL PROCUREMENT, ORGAN
Anesthesia: Choice

## 2023-09-26 MED ORDER — LACTATED RINGERS IV SOLN: INTRAVENOUS | Status: AC

## 2023-09-26 MED ORDER — MORPHINE 100MG IN NS 100ML (1MG/ML) PREMIX INFUSION
1.0000 mg/h | INTRAVENOUS | Status: DC
  Administered 2023-09-26: 5 mg/h via INTRAVENOUS
  Filled 2023-09-26: qty 100

## 2023-09-26 MED ORDER — ONDANSETRON HCL 4 MG/2ML IJ SOLN: 4.0000 mg | Freq: Four times a day (QID) | INTRAMUSCULAR | Status: DC | PRN

## 2023-09-26 MED ORDER — LABETALOL HCL 5 MG/ML IV SOLN
10.0000 mg | INTRAVENOUS | Status: DC | PRN
  Administered 2023-09-26 (×2): 10 mg via INTRAVENOUS
  Filled 2023-09-26 (×2): qty 4

## 2023-09-26 MED ORDER — POLYVINYL ALCOHOL 1.4 % OP SOLN: 1.0000 [drp] | Freq: Four times a day (QID) | OPHTHALMIC | Status: DC | PRN

## 2023-09-26 MED ORDER — SODIUM CHLORIDE 0.9% IV SOLUTION: Freq: Once | INTRAVENOUS | Status: DC

## 2023-09-26 MED ORDER — HEPARIN SODIUM (PORCINE) 1000 UNIT/ML IJ SOLN
30000.0000 [IU] | Freq: Once | INTRAMUSCULAR | Status: DC
  Filled 2023-09-26: qty 30

## 2023-09-26 MED ORDER — GLYCOPYRROLATE 0.2 MG/ML IJ SOLN
0.2000 mg | INTRAMUSCULAR | Status: DC | PRN
  Administered 2023-09-26: 0.2 mg via INTRAVENOUS
  Filled 2023-09-26: qty 1

## 2023-09-26 MED ORDER — IOHEXOL 350 MG/ML SOLN
75.0000 mL | Freq: Once | INTRAVENOUS | Status: AC | PRN
  Administered 2023-09-26: 75 mL via INTRAVENOUS

## 2023-09-26 MED ORDER — GLYCOPYRROLATE 0.2 MG/ML IJ SOLN: 0.2000 mg | INTRAMUSCULAR | Status: DC | PRN

## 2023-09-26 MED ORDER — LABETALOL HCL 5 MG/ML IV SOLN
10.0000 mg | INTRAVENOUS | Status: DC | PRN
  Administered 2023-09-26 – 2023-09-27 (×16): 10 mg via INTRAVENOUS
  Filled 2023-09-26 (×9): qty 4

## 2023-09-26 MED ORDER — ONDANSETRON 4 MG PO TBDP: 4.0000 mg | ORAL_TABLET | Freq: Four times a day (QID) | ORAL | Status: DC | PRN

## 2023-09-26 MED ORDER — HALOPERIDOL LACTATE 5 MG/ML IJ SOLN: 0.5000 mg | INTRAMUSCULAR | Status: DC | PRN

## 2023-09-26 MED ORDER — LORAZEPAM 2 MG/ML IJ SOLN
1.0000 mg | INTRAMUSCULAR | Status: DC | PRN
  Filled 2023-09-26: qty 1

## 2023-09-26 MED ORDER — HALOPERIDOL 0.5 MG PO TABS: 0.5000 mg | ORAL_TABLET | ORAL | Status: DC | PRN

## 2023-09-26 MED ORDER — GLYCOPYRROLATE 1 MG PO TABS: 1.0000 mg | ORAL_TABLET | ORAL | Status: DC | PRN

## 2023-09-26 MED ORDER — 0.9 % SODIUM CHLORIDE (POUR BTL) OPTIME: TOPICAL | Status: DC | PRN

## 2023-09-26 MED ORDER — LORAZEPAM 2 MG/ML PO CONC: 1.0000 mg | ORAL | Status: DC | PRN

## 2023-09-26 MED ORDER — MORPHINE BOLUS VIA INFUSION
1.0000 mg | INTRAVENOUS | Status: DC | PRN
  Administered 2023-09-27: 2 mg via INTRAVENOUS
  Administered 2023-09-27: 4 mg via INTRAVENOUS

## 2023-09-26 MED ORDER — HALOPERIDOL LACTATE 2 MG/ML PO CONC: 0.5000 mg | ORAL | Status: DC | PRN

## 2023-09-26 MED ORDER — PERFLUTREN LIPID MICROSPHERE
1.0000 mL | INTRAVENOUS | Status: AC | PRN
  Administered 2023-09-26: 4 mL via INTRAVENOUS

## 2023-09-26 MED ORDER — BIOTENE DRY MOUTH MT LIQD: 15.0000 mL | OROMUCOSAL | Status: DC | PRN

## 2023-09-26 MED ORDER — SODIUM CHLORIDE 0.9 % IV SOLN: INTRAVENOUS | Status: DC

## 2023-09-26 MED ORDER — PIPERACILLIN-TAZOBACTAM 3.375 G IVPB 30 MIN
3.3750 g | Freq: Once | INTRAVENOUS | Status: AC
  Administered 2023-09-26: 3.375 g via INTRAVENOUS
  Filled 2023-09-26 (×2): qty 50

## 2023-09-26 MED ORDER — LORAZEPAM 1 MG PO TABS: 1.0000 mg | ORAL_TABLET | ORAL | Status: DC | PRN

## 2023-09-26 NOTE — Progress Notes (Signed)
 Patient ID: Heather Padilla, female   DOB: 03/22/1961, 63 y.o.   MRN: [id number] Ms. Mercy Riding will go tonight for organ donation.

## 2023-09-26 NOTE — Progress Notes (Signed)
@   1935 called CCMD to get orders for comfort medications. Patient had med orders that were based on CPOT and RASS and we needed meds appropriate for comfort care measures. Orders received @ 2015 for comfort meds. @ 2045 this nurse stopped the fentanyl  gtt to start the morphine  gtt (2046 started morphine @ 5mg ) per order. This nurse suctioned the patient via the inline suction in ETT. The patients HR and SBP increased so a bolus of 2mg  was given to the patient @ 2056 for comfort. @ 2104 this nurse paused the morphne gtt DT the patient opening right eye. At this point a focused neuro exam was done and after this RN and multiple other RN's assessed this patient it was determined that patient was following commands with her eyes.[facility] notified and at bedside, CCMD notified, Neurosurgery notified all MD's and this nurse agree to hold OR tonight and reassess tomorrow.

## 2023-09-26 NOTE — Progress Notes (Signed)
 Patient transported from 4N19 to CT and back via vent without incident

## 2023-09-26 NOTE — Plan of Care (Addendum)
 Patient neuro status unchanged during shift. PRN Labetalol  given as needed to maintain blood pressure parameter goals. PRN Fentanyl  boluses given for vent compliance, patient responded well to interventions. Family at bedside to discuss plan of care. ICU status maintained.   Problem: Education: Goal: Knowledge of General Education information will improve Description: Including pain rating scale, medication(s)/side effects and non-pharmacologic comfort measures Outcome: Not Progressing   Problem: Health Behavior/Discharge Planning: Goal: Ability to manage health-related needs will improve Outcome: Not Progressing   Problem: Clinical Measurements: Goal: Ability to maintain clinical measurements within normal limits will improve Outcome: Not Progressing Goal: Will remain free from infection Outcome: Not Progressing Goal: Diagnostic test results will improve Outcome: Not Progressing Goal: Respiratory complications will improve Outcome: Not Progressing Goal: Cardiovascular complication will be avoided Outcome: Not Progressing   Problem: Activity: Goal: Risk for activity intolerance will decrease Outcome: Not Progressing   Problem: Nutrition: Goal: Adequate nutrition will be maintained Outcome: Not Progressing   Problem: Coping: Goal: Level of anxiety will decrease Outcome: Not Progressing   Problem: Elimination: Goal: Will not experience complications related to bowel motility Outcome: Not Progressing Goal: Will not experience complications related to urinary retention Outcome: Not Progressing   Problem: Pain Managment: Goal: General experience of comfort will improve and/or be controlled Outcome: Not Progressing   Problem: Safety: Goal: Ability to remain free from injury will improve Outcome: Not Progressing   Problem: Skin Integrity: Goal: Risk for impaired skin integrity will decrease Outcome: Not Progressing   Problem: Education: Goal: Ability to describe  self-care measures that may prevent or decrease complications (Diabetes Survival Skills Education) will improve Outcome: Not Progressing Goal: Individualized Educational Video(s) Outcome: Not Progressing   Problem: Coping: Goal: Ability to adjust to condition or change in health will improve Outcome: Not Progressing   Problem: Fluid Volume: Goal: Ability to maintain a balanced intake and output will improve Outcome: Not Progressing   Problem: Health Behavior/Discharge Planning: Goal: Ability to identify and utilize available resources and services will improve Outcome: Not Progressing Goal: Ability to manage health-related needs will improve Outcome: Not Progressing   Problem: Metabolic: Goal: Ability to maintain appropriate glucose levels will improve Outcome: Not Progressing   Problem: Nutritional: Goal: Maintenance of adequate nutrition will improve Outcome: Not Progressing Goal: Progress toward achieving an optimal weight will improve Outcome: Not Progressing   Problem: Skin Integrity: Goal: Risk for impaired skin integrity will decrease Outcome: Not Progressing   Problem: Tissue Perfusion: Goal: Adequacy of tissue perfusion will improve Outcome: Not Progressing

## 2023-09-26 NOTE — Progress Notes (Signed)
*  PRELIMINARY RESULTS* Echocardiogram 2D Echocardiogram has been performed.  Heather Padilla 09/26/2023, 8:57 AM

## 2023-09-27 DIAGNOSIS — I619 Nontraumatic intracerebral hemorrhage, unspecified: Secondary | ICD-10-CM | POA: Diagnosis not present

## 2023-09-27 LAB — CBC WITH DIFFERENTIAL/PLATELET
Abs Immature Granulocytes: 0.11 10*3/uL — ABNORMAL HIGH (ref 0.00–0.07)
Abs Immature Granulocytes: 0.09 10*3/uL — ABNORMAL HIGH (ref 0.00–0.07)
Basophils Absolute: 0 10*3/uL (ref 0.0–0.1)
Basophils Absolute: 0 10*3/uL (ref 0.0–0.1)
Basophils Relative: 0 %
Basophils Relative: 0 %
Eosinophils Absolute: 0.2 10*3/uL (ref 0.0–0.5)
Eosinophils Absolute: 0.2 10*3/uL (ref 0.0–0.5)
Eosinophils Relative: 2 %
Eosinophils Relative: 2 %
HCT: 24.9 % — ABNORMAL LOW (ref 36.0–46.0)
HCT: 24.4 % — ABNORMAL LOW (ref 36.0–46.0)
Hemoglobin: 8.2 g/dL — ABNORMAL LOW (ref 12.0–15.0)
Hemoglobin: 7.8 g/dL — ABNORMAL LOW (ref 12.0–15.0)
Immature Granulocytes: 1 %
Immature Granulocytes: 1 %
Lymphocytes Relative: 8 %
Lymphocytes Relative: 7 %
Lymphs Abs: 1 10*3/uL (ref 0.7–4.0)
Lymphs Abs: 0.9 10*3/uL (ref 0.7–4.0)
MCH: 33.6 pg (ref 26.0–34.0)
MCH: 32.5 pg (ref 26.0–34.0)
MCHC: 32.9 g/dL (ref 30.0–36.0)
MCHC: 32 g/dL (ref 30.0–36.0)
MCV: 102 fL — ABNORMAL HIGH (ref 80.0–100.0)
MCV: 101.7 fL — ABNORMAL HIGH (ref 80.0–100.0)
Monocytes Absolute: 1.2 10*3/uL — ABNORMAL HIGH (ref 0.1–1.0)
Monocytes Absolute: 1.2 10*3/uL — ABNORMAL HIGH (ref 0.1–1.0)
Monocytes Relative: 10 %
Monocytes Relative: 10 %
Neutro Abs: 9.8 10*3/uL — ABNORMAL HIGH (ref 1.7–7.7)
Neutro Abs: 9.8 10*3/uL — ABNORMAL HIGH (ref 1.7–7.7)
Neutrophils Relative %: 79 %
Neutrophils Relative %: 80 %
Platelets: 188 10*3/uL (ref 150–400)
Platelets: 192 10*3/uL (ref 150–400)
RBC: 2.44 MIL/uL — ABNORMAL LOW (ref 3.87–5.11)
RBC: 2.4 MIL/uL — ABNORMAL LOW (ref 3.87–5.11)
RDW: 13.4 % (ref 11.5–15.5)
RDW: 13.4 % (ref 11.5–15.5)
WBC: 12.4 10*3/uL — ABNORMAL HIGH (ref 4.0–10.5)
WBC: 12.2 10*3/uL — ABNORMAL HIGH (ref 4.0–10.5)
nRBC: 0 % (ref 0.0–0.2)
nRBC: 0 % (ref 0.0–0.2)

## 2023-09-27 LAB — POCT I-STAT 7, (LYTES, BLD GAS, ICA,H+H)
Acid-Base Excess: 0 mmol/L (ref 0.0–2.0)
Acid-base deficit: 1 mmol/L (ref 0.0–2.0)
Bicarbonate: 23.7 mmol/L (ref 20.0–28.0)
Bicarbonate: 25.1 mmol/L (ref 20.0–28.0)
Calcium, Ion: 1.23 mmol/L (ref 1.15–1.40)
Calcium, Ion: 1.25 mmol/L (ref 1.15–1.40)
HCT: 24 % — ABNORMAL LOW (ref 36.0–46.0)
HCT: 24 % — ABNORMAL LOW (ref 36.0–46.0)
Hemoglobin: 8.2 g/dL — ABNORMAL LOW (ref 12.0–15.0)
Hemoglobin: 8.2 g/dL — ABNORMAL LOW (ref 12.0–15.0)
O2 Saturation: 100 %
O2 Saturation: 100 %
Patient temperature: 36.6
Patient temperature: 37.2
Potassium: 3.6 mmol/L (ref 3.5–5.1)
Potassium: 3.6 mmol/L (ref 3.5–5.1)
Sodium: 141 mmol/L (ref 135–145)
Sodium: 140 mmol/L (ref 135–145)
TCO2: 25 mmol/L (ref 22–32)
TCO2: 26 mmol/L (ref 22–32)
pCO2 arterial: 39.3 mm[Hg] (ref 32–48)
pCO2 arterial: 41.5 mm[Hg] (ref 32–48)
pH, Arterial: 7.386 (ref 7.35–7.45)
pH, Arterial: 7.39 (ref 7.35–7.45)
pO2, Arterial: 168 mm[Hg] — ABNORMAL HIGH (ref 83–108)
pO2, Arterial: 201 mm[Hg] — ABNORMAL HIGH (ref 83–108)

## 2023-09-27 LAB — PHOSPHORUS
Phosphorus: 3.3 mg/dL (ref 2.5–4.6)
Phosphorus: 3.5 mg/dL (ref 2.5–4.6)

## 2023-09-27 LAB — COMPREHENSIVE METABOLIC PANEL
ALT: 16 U/L (ref 0–44)
ALT: 15 U/L (ref 0–44)
AST: 25 U/L (ref 15–41)
AST: 24 U/L (ref 15–41)
Albumin: 2.4 g/dL — ABNORMAL LOW (ref 3.5–5.0)
Albumin: 2.3 g/dL — ABNORMAL LOW (ref 3.5–5.0)
Alkaline Phosphatase: 45 U/L (ref 38–126)
Alkaline Phosphatase: 41 U/L (ref 38–126)
Anion gap: 12 (ref 5–15)
Anion gap: 11 (ref 5–15)
BUN: 11 mg/dL (ref 8–23)
BUN: 10 mg/dL (ref 8–23)
CO2: 22 mmol/L (ref 22–32)
CO2: 22 mmol/L (ref 22–32)
Calcium: 8.5 mg/dL — ABNORMAL LOW (ref 8.9–10.3)
Calcium: 8.5 mg/dL — ABNORMAL LOW (ref 8.9–10.3)
Chloride: 108 mmol/L (ref 98–111)
Chloride: 109 mmol/L (ref 98–111)
Creatinine, Ser: 0.58 mg/dL (ref 0.44–1.00)
Creatinine, Ser: 0.7 mg/dL (ref 0.44–1.00)
GFR, Estimated: >60 mL/min (ref >60)
GFR, Estimated: >60 mL/min (ref >60)
Glucose, Bld: 114 mg/dL — ABNORMAL HIGH (ref 70–99)
Glucose, Bld: 106 mg/dL — ABNORMAL HIGH (ref 70–99)
Potassium: 3.7 mmol/L (ref 3.5–5.1)
Potassium: 3.6 mmol/L (ref 3.5–5.1)
Sodium: 142 mmol/L (ref 135–145)
Sodium: 142 mmol/L (ref 135–145)
Total Bilirubin: 0.7 mg/dL (ref 0.0–1.2)
Total Bilirubin: 0.7 mg/dL (ref 0.0–1.2)
Total Protein: 5.4 g/dL — ABNORMAL LOW (ref 6.5–8.1)
Total Protein: 5.4 g/dL — ABNORMAL LOW (ref 6.5–8.1)

## 2023-09-27 LAB — MAGNESIUM
Magnesium: 2 mg/dL (ref 1.7–2.4)
Magnesium: 2.1 mg/dL (ref 1.7–2.4)

## 2023-09-27 LAB — PROTIME-INR
INR: 1 (ref 0.8–1.2)
INR: 1 (ref 0.8–1.2)
Prothrombin Time: 13.4 s (ref 11.4–15.2)
Prothrombin Time: 13.7 s (ref 11.4–15.2)

## 2023-09-27 LAB — BILIRUBIN, DIRECT
Bilirubin, Direct: 0.1 mg/dL (ref 0.0–0.2)
Bilirubin, Direct: 0.1 mg/dL (ref 0.0–0.2)

## 2023-09-27 LAB — GLUCOSE, CAPILLARY
Glucose-Capillary: 118 mg/dL — ABNORMAL HIGH (ref 70–99)
Glucose-Capillary: 95 mg/dL (ref 70–99)
Glucose-Capillary: 99 mg/dL (ref 70–99)
Glucose-Capillary: 96 mg/dL (ref 70–99)

## 2023-09-27 LAB — APTT: aPTT: 27 s (ref 24–36)

## 2023-09-27 MED ORDER — GLYCOPYRROLATE 1 MG PO TABS: 1.0000 mg | ORAL_TABLET | ORAL | Status: DC | PRN

## 2023-09-27 MED ORDER — LACTATED RINGERS IV SOLN: INTRAVENOUS | Status: DC

## 2023-09-27 MED ORDER — MORPHINE 100MG IN NS 100ML (1MG/ML) PREMIX INFUSION
0.0000 mg/h | INTRAVENOUS | Status: DC
  Administered 2023-09-27: 5 mg/h via INTRAVENOUS
  Administered 2023-09-28 (×2): 20 mg/h via INTRAVENOUS
  Administered 2023-09-28: 9 mg/h via INTRAVENOUS
  Administered 2023-09-28 – 2023-09-30 (×7): 20 mg/h via INTRAVENOUS
  Filled 2023-09-27 (×11): qty 100

## 2023-09-27 MED ORDER — ACETAMINOPHEN 325 MG PO TABS: 650.0000 mg | ORAL_TABLET | Freq: Four times a day (QID) | ORAL | Status: DC | PRN

## 2023-09-27 MED ORDER — GLYCOPYRROLATE 0.2 MG/ML IJ SOLN
0.2000 mg | INTRAMUSCULAR | Status: DC | PRN
  Administered 2023-09-27 – 2023-09-28 (×2): 0.2 mg via INTRAVENOUS
  Filled 2023-09-27 (×2): qty 1

## 2023-09-27 MED ORDER — LORAZEPAM 2 MG/ML IJ SOLN
2.0000 mg | INTRAMUSCULAR | Status: DC | PRN
  Administered 2023-09-28: 2 mg via INTRAVENOUS
  Filled 2023-09-27: qty 1

## 2023-09-27 MED ORDER — GLYCOPYRROLATE 0.2 MG/ML IJ SOLN: 0.2000 mg | INTRAMUSCULAR | Status: DC | PRN

## 2023-09-27 MED ORDER — MORPHINE BOLUS VIA INFUSION
5.0000 mg | INTRAVENOUS | Status: DC | PRN
  Administered 2023-09-27 – 2023-09-28 (×12): 5 mg via INTRAVENOUS

## 2023-09-27 MED ORDER — POLYVINYL ALCOHOL 1.4 % OP SOLN: 1.0000 [drp] | Freq: Four times a day (QID) | OPHTHALMIC | Status: DC | PRN

## 2023-09-27 MED ORDER — ACETAMINOPHEN 650 MG RE SUPP: 650.0000 mg | Freq: Four times a day (QID) | RECTAL | Status: DC | PRN

## 2023-09-27 MED ORDER — SODIUM CHLORIDE 0.9 % IV SOLN: INTRAVENOUS | Status: DC

## 2023-09-27 NOTE — Plan of Care (Signed)
 PRN Labetalol  given during shift for hypertensive, patient responded well to treatment. Morphine  boluses given as needed. Family at bedside, patient made comfort status. Family updated in plan of care. ICU status maintained.    Problem: Education: Goal: Knowledge of General Education information will improve Description: Including pain rating scale, medication(s)/side effects and non-pharmacologic comfort measures Outcome: Not Progressing   Problem: Health Behavior/Discharge Planning: Goal: Ability to manage health-related needs will improve Outcome: Not Progressing   Problem: Clinical Measurements: Goal: Ability to maintain clinical measurements within normal limits will improve Outcome: Not Progressing Goal: Will remain free from infection Outcome: Not Progressing Goal: Diagnostic test results will improve Outcome: Not Progressing Goal: Respiratory complications will improve Outcome: Not Progressing Goal: Cardiovascular complication will be avoided Outcome: Not Progressing   Problem: Activity: Goal: Risk for activity intolerance will decrease Outcome: Not Progressing   Problem: Nutrition: Goal: Adequate nutrition will be maintained Outcome: Not Progressing   Problem: Coping: Goal: Level of anxiety will decrease Outcome: Not Progressing   Problem: Elimination: Goal: Will not experience complications related to bowel motility Outcome: Not Progressing Goal: Will not experience complications related to urinary retention Outcome: Not Progressing   Problem: Pain Managment: Goal: General experience of comfort will improve and/or be controlled Outcome: Not Progressing   Problem: Safety: Goal: Ability to remain free from injury will improve Outcome: Not Progressing   Problem: Skin Integrity: Goal: Risk for impaired skin integrity will decrease Outcome: Not Progressing   Problem: Education: Goal: Ability to describe self-care measures that may prevent or decrease  complications (Diabetes Survival Skills Education) will improve Outcome: Not Progressing Goal: Individualized Educational Video(s) Outcome: Not Progressing   Problem: Coping: Goal: Ability to adjust to condition or change in health will improve Outcome: Not Progressing   Problem: Fluid Volume: Goal: Ability to maintain a balanced intake and output will improve Outcome: Not Progressing   Problem: Health Behavior/Discharge Planning: Goal: Ability to identify and utilize available resources and services will improve Outcome: Not Progressing Goal: Ability to manage health-related needs will improve Outcome: Not Progressing   Problem: Metabolic: Goal: Ability to maintain appropriate glucose levels will improve Outcome: Not Progressing   Problem: Nutritional: Goal: Maintenance of adequate nutrition will improve Outcome: Not Progressing Goal: Progress toward achieving an optimal weight will improve Outcome: Not Progressing   Problem: Skin Integrity: Goal: Risk for impaired skin integrity will decrease Outcome: Not Progressing   Problem: Tissue Perfusion: Goal: Adequacy of tissue perfusion will improve Outcome: Not Progressing   Problem: Education: Goal: Knowledge of the prescribed therapeutic regimen will improve Outcome: Not Progressing   Problem: Coping: Goal: Ability to identify and develop effective coping behavior will improve Outcome: Not Progressing   Problem: Clinical Measurements: Goal: Quality of life will improve Outcome: Not Progressing   Problem: Respiratory: Goal: Verbalizations of increased ease of respirations will increase Outcome: Not Progressing   Problem: Role Relationship: Goal: Family's ability to cope with current situation will improve Outcome: Not Progressing Goal: Ability to verbalize concerns, feelings, and thoughts to partner or family member will improve Outcome: Not Progressing   Problem: Pain Management: Goal: Satisfaction with pain  management regimen will improve Outcome: Not Progressing

## 2023-09-27 NOTE — Progress Notes (Signed)
 Patient ID: Heather Padilla, female   DOB: Jul 14, 1961, 63 y.o.   MRN: [id number] BP (!) 158/81   Pulse 96   Temp 99.5 F (37.5 C)   Resp 15   Ht 5' 7 (1.702 m)   Wt 64.5 kg   SpO2 99%   BMI 22.27 kg/m  Will open eyes, not following commands.  +cough I along with Dr. Arlinda spoke to her sister and sister's husband. They would like to continue comfort care.  I will not be operating to remove the subdural hematoma.

## 2023-09-27 NOTE — Procedures (Signed)
 Extubation Procedure Note  Patient Details:   Name: FIDENCIA MCCLOUD DOB: [birth year] MRN: [id number]   Airway Documentation:    Vent end date: 09/27/23 Vent end time: 2101   Evaluation  O2 sats: currently acceptable Complications: No apparent complications  Bilateral Breath Sounds: Clear, Diminished     Nat LITTIE Ram 09/27/2023, 9:24 PM  Pt extubated per order to comfort care. No complications noted at this time

## 2023-09-27 NOTE — Progress Notes (Signed)
 NAME:  Heather Padilla, MRN:  [id number], DOB:  10-Jul-1961, LOS: 4 ADMISSION DATE:  09/23/2023, CONSULTATION DATE:  09/23/2023 REFERRING MD:  Gillie - NSGY, CHIEF COMPLAINT: Subdural hematoma   History of Present Illness:  63 year old female with no documented past medical history who presented to the emergency department 2/2 evening as a level 1 trauma after a fall last night and found unresponsive this morning.  Husband states that this morning she seemed to be sleeping and when he checked on her at 4 in the afternoon she was unresponsive.  With EMS noted to have blown left pupil, possible decorticate posturing, hypertensive with systolics in the 200s.  Patient was intubated immediately upon arrival to the ED.  She was noted to have a large subdural hematoma over the left cerebral convexity with 1.7 cm of midline shift and evidence of herniation.  Labs notable for Na 132, K 3.4, bicarb 17, AG 17, WBC 20.7, i-STAT lactic 3.7, ABG with pH 7.33, pCO2 37, pO2 525.  She was evaluated by neurosurgery shortly after her arrival.  Given very poor clinical exam, did not feel that operative intervention was appropriate.  Patient was admitted to ICU.  CCM consulted for vent management.  Pertinent Medical History:  None documented  Significant Hospital Events: Including procedures, antibiotic start and stop dates in addition to other pertinent events   2/2 -  ED as a level 1 trauma, unresponsive, intubated, CT head with large subdural and uncal herniation.  Admitted to ICU. 2/5 -attempted DCD organ donation, but patient blinked her eyes while sedation was interrupted and organ procurement process was aborted.  Interim History / Subjective:   Exam remains poor.   Objective:  Blood pressure (!) 158/81, pulse 96, temperature 99.5 F (37.5 C), resp. rate 15, height 5' 7 (1.702 m), weight 64.5 kg, SpO2 99%.    Vent Mode: PRVC FiO2 (%):  [40 %] 40 % Set Rate:  [15 bmp] 15 bmp Vt Set:  [490 mL]  490 mL PEEP:  [5 cmH20] 5 cmH20 Plateau Pressure:  [14 cmH20-16 cmH20] 16 cmH20   Intake/Output Summary (Last 24 hours) at 09/27/2023 1727 Last data filed at 09/27/2023 1200 Gross per 24 hour  Intake 1471.31 ml  Output 750 ml  Net 721.31 ml   Filed Weights   09/25/23 0456 09/26/23 0500 09/27/23 0500  Weight: 62 kg 62.1 kg 64.5 kg   Physical Examination: General: Acutely ill-appearing middle-aged woman in NAD. HEENT: Periorbital ecchymosis. Neuro: Off sedation the patient will occasionally flicker her eyes open to voice and painful stimulation but does not track.  She is not following commands.  Positive grasp reflex only.  No response to painful stimuli in the limbs. CV: Heart sounds are unremarkable. PULM: Chest clear to auscultation bilaterally GI: Soft Extremities: No significant LE edema noted. Skin: Warm/dry, no rashes. L periorbital ecchymosis as described above.  Ancillary tests personally reviewed:  No new investigations.  Assessment & Plan:  Acute left subdural hematoma with brain compression Uncal herniation  Acute hypoxic respiratory failure 2/2 above   Plan:  -Large subdural has resulted in catastrophic neurological injury.  Survival is unlikely and there is no realistic possibility of recovery anything more than a highly dependent state. -Dr. Gillie and I spoke with the family at length and explained that while the observed eye opening would preclude her from proceeding to donation after cardiac death, it was not indicative of a significant neurological improvement and that the expectation is that she is still  neurologically devastated and will not achieve the capacity for independent living. -The family is satisfied and after conferring have decided to resume the original plan of transition to comfort care and compassionate extubation.   Best Practice: (right click and Reselect all SmartList Selections daily)   Diet/type: NPO DVT prophylaxis: SCD Pressure  ulcer(s): na GI prophylaxis: H2B Lines: N/A Foley:  Yes, and it is still needed Code Status:  full code Last date of multidisciplinary goals of care discussion [pending]  Critical care time:   The patient is critically ill with multiple organ system failure and requires high complexity decision making for assessment and support, frequent evaluation and titration of therapies, advanced monitoring, review of radiographic studies and interpretation of complex data.   Critical Care Time devoted to patient care services, exclusive of separately billable procedures, described in this note is 35 minutes.  Fredia Alderton, MD [facility] 09/27/23 5:27 PM  Please see [email or web address] for pager details.  From 7A-7P if no response, please call [phone] After hours, please call ELink [phone]

## 2023-09-27 NOTE — Progress Notes (Signed)
   09/27/23 1315  Spiritual Encounters  Type of Visit Initial  Care provided to: Pt and family  Conversation partners present during encounter Nurse  Referral source Family  Reason for visit End-of-life  OnCall Visit No  Spiritual Framework  Presenting Themes Values and beliefs;Significant life change;Impactful experiences and emotions  Family Stress Factors Loss;Major life changes  Interventions  Spiritual Care Interventions Made Reflective listening;Normalization of emotions;Compassionate presence;Decision-making support/facilitation;Bereavement/grief support;Provided grief education  Intervention Outcomes  Outcomes Connection to spiritual care;Awareness around self/spiritual resourses;Reduced anxiety;Patient family open to resources    Chaplain responded to consult for funeral home recommendations and EOL questions. Upon arrival, RN was working with pt to assess cognitive abilities. Pt seemed temporarily responsive and opened eyes, blinked on command.  Chaplain provided compassionate presence and reflective listening for pt's partner who was at bedside, along with partner's daughter. Partner indicated he had already received funeral home information. He communicated his shock, and questioned if pt's sudden apparent ability to respond was normal or a miracle. I explained I was unable to answer that question at this time, although RN later expressed to me (after visit concluded) that the activity is indeed unusual.  Partner informed me that pt was not a particularly spiritual person, but welcomed chaplain's company during that time. Chaplain made partner aware we are available 24/7 if needed again.

## 2023-09-28 LAB — CULTURE, RESPIRATORY W GRAM STAIN: Culture: NORMAL

## 2023-09-28 NOTE — Progress Notes (Signed)
 NAME:  LIBNI FUSARO, MRN:  [id number], DOB:  [birth year], LOS: 5 ADMISSION DATE:  09/23/2023, CONSULTATION DATE:  09/23/2023 REFERRING MD:  Gillie - NSGY, CHIEF COMPLAINT: Subdural hematoma   History of Present Illness:  63 year old female with no documented past medical history who presented to the emergency department 2/2 evening as a level 1 trauma after a fall last night and found unresponsive this morning.  Husband states that this morning she seemed to be sleeping and when he checked on her at 4 in the afternoon she was unresponsive.  With EMS noted to have blown left pupil, possible decorticate posturing, hypertensive with systolics in the 200s.  Patient was intubated immediately upon arrival to the ED.  She was noted to have a large subdural hematoma over the left cerebral convexity with 1.7 cm of midline shift and evidence of herniation.  Labs notable for Na 132, K 3.4, bicarb 17, AG 17, WBC 20.7, i-STAT lactic 3.7, ABG with pH 7.33, pCO2 37, pO2 525.  She was evaluated by neurosurgery shortly after her arrival.  Given very poor clinical exam, did not feel that operative intervention was appropriate.  Patient was admitted to ICU.  CCM consulted for vent management.  Pertinent Medical History:  None documented  Significant Hospital Events: Including procedures, antibiotic start and stop dates in addition to other pertinent events   2/2 -  ED as a level 1 trauma, unresponsive, intubated, CT head with large subdural and uncal herniation.  Admitted to ICU. 2/5 -attempted DCD organ donation, but patient blinked her eyes while sedation was interrupted and organ procurement process was aborted.  Interim History / Subjective:   Exam remains poor.  Some increased work of breathing.   Objective:  Blood pressure (!) 171/85, pulse (!) 133, temperature (!) 101.5 F (38.6 C), resp. rate (!) 28, height 5' 7 (1.702 m), weight 64.5 kg, SpO2 (!) 65%.    Vent Mode: PRVC FiO2 (%):  [40 %]  40 % Set Rate:  [15 bmp] 15 bmp Vt Set:  [490 mL] 490 mL PEEP:  [5 cmH20] 5 cmH20 Plateau Pressure:  [14 cmH20-16 cmH20] 16 cmH20   Intake/Output Summary (Last 24 hours) at 09/28/2023 0944 Last data filed at 09/27/2023 2200 Gross per 24 hour  Intake 573.04 ml  Output 405 ml  Net 168.04 ml   Filed Weights   09/25/23 0456 09/26/23 0500 09/27/23 0500  Weight: 62 kg 62.1 kg 64.5 kg   Physical Examination: General: Acutely ill-appearing middle-aged woman in NAD. HEENT: Periorbital ecchymosis. Neuro: On morphine  unresponsive to voice. CV: Heart sounds are unremarkable. PULM: Chest clear to auscultation bilaterally, mouth breathing, tachypnea GI: Soft Extremities: No significant LE edema noted. Skin: Warm/dry, no rashes. L periorbital ecchymosis as described above.  Ancillary tests personally reviewed:  No new investigations.  Assessment & Plan:  Acute left subdural hematoma with brain compression Uncal herniation  Acute hypoxic respiratory failure 2/2 above   Plan:  -Large subdural has resulted in catastrophic neurological injury.  Survival is unlikely and there is no realistic possibility of recovery anything more than a highly dependent state. -Dr. Gillie and I spoke with the family at length and explained that while the observed eye opening would preclude her from proceeding to donation after cardiac death, it was not indicative of a significant neurological improvement and that the expectation is that she is still neurologically devastated and will not achieve the capacity for independent living. -The family is satisfied and after conferring have decided to  resume the original plan of transition to comfort care and compassionate extubation. -She is now on comfort care and saturations are slowly dropping.  I expect patient will pass within the next 24 hours.  Will move her out of the ICU.   Fredia Alderton, MD [facility] 09/28/23 9:44 AM  Please see  [email or web address] for pager details.  From 7A-7P if no response, please call [phone] After hours, please call ELink [phone]

## 2023-09-28 NOTE — Plan of Care (Signed)
  Problem: Clinical Measurements: Goal: Will remain free from infection Outcome: Progressing Goal: Cardiovascular complication will be avoided Outcome: Progressing   Problem: Coping: Goal: Level of anxiety will decrease Outcome: Progressing   Problem: Elimination: Goal: Will not experience complications related to urinary retention Outcome: Progressing   Problem: Pain Managment: Goal: General experience of comfort will improve and/or be controlled Outcome: Progressing   Problem: Safety: Goal: Ability to remain free from injury will improve Outcome: Progressing   Problem: Skin Integrity: Goal: Risk for impaired skin integrity will decrease Outcome: Progressing   Problem: Coping: Goal: Ability to adjust to condition or change in health will improve Outcome: Progressing   Problem: Skin Integrity: Goal: Risk for impaired skin integrity will decrease Outcome: Progressing   Problem: Coping: Goal: Ability to identify and develop effective coping behavior will improve Outcome: Progressing   Problem: Clinical Measurements: Goal: Quality of life will improve Outcome: Progressing   Problem: Role Relationship: Goal: Family's ability to cope with current situation will improve Outcome: Progressing   Problem: Pain Management: Goal: Satisfaction with pain management regimen will improve Outcome: Progressing

## 2023-09-28 NOTE — TOC Initial Note (Signed)
 Transition of Care [facility]) - Initial/Assessment Note    Patient Details  Name: Heather Padilla MRN: [id number] Date of Birth: 1961/06/05  [facility] CM/SW Contact:    Lauraine FORBES Saa, LCSW Phone Number: 09/28/2023, 3:54 PM  Clinical Narrative:                  3:54 PM Per chart review, patient has been extubated and is now on room air. Patient remains coded as comfort care.    Barriers to Discharge: Continued Medical Work up   Patient Goals and CMS Choice            Expected Discharge Plan and Services       Living arrangements for the past 2 months: Single Family Home                                      Prior Living Arrangements/Services Living arrangements for the past 2 months: Single Family Home Lives with:: Spouse Patient language and need for interpreter reviewed:: Yes        Need for Family Participation in Patient Care: Yes (Comment) Care giver support system in place?: Yes (comment)   Criminal Activity/Legal Involvement Pertinent to Current Situation/Hospitalization: No - Comment as needed  Activities of Daily Living      Permission Sought/Granted Permission sought to share information with : Family Supports Permission granted to share information with : No (Contact information on chart)  Share Information with NAME: Deward Aver     Permission granted to share info w Relationship: Friend  Permission granted to share info w Contact Information: [phone]  Emotional Assessment   Attitude/Demeanor/Rapport: Unable to Assess Affect (typically observed): Unable to Assess   Alcohol  / Substance Use: Not Applicable Psych Involvement: No (comment)  Admission diagnosis:  Subdural hemorrhage (HCC) [I62.00] SDH (subdural hematoma) (HCC) [S06.5XAA] Acute subdural hematoma (HCC) [S06.5XAA] Fall, initial encounter [id number]] Patient Active Problem List   Diagnosis Date Noted   SDH (subdural hematoma) (HCC) 09/23/2023    Acute subdural hematoma (HCC) 09/23/2023   PCP:  Patient, No Pcp Per Pharmacy:  No Pharmacies Listed    Social Drivers of Health (SDOH) Social History: SDOH Screenings   Food Insecurity: Patient Unable To Answer (09/23/2023)  Housing: Patient Unable To Answer (09/23/2023)  Transportation Needs: Patient Unable To Answer (09/23/2023)  Utilities: Patient Unable To Answer (09/23/2023)  Tobacco Use: Low Risk  (09/23/2023)   SDOH Interventions:     Readmission Risk Interventions     No data to display

## 2023-09-29 DIAGNOSIS — S065XAA Traumatic subdural hemorrhage with loss of consciousness status unknown, initial encounter: Principal | ICD-10-CM

## 2023-09-29 LAB — BPAM RBC
Blood Product Expiration Date: 202503082359
Blood Product Expiration Date: 202503082359
Blood Product Expiration Date: 202503082359
Blood Product Expiration Date: 202503082359
Blood Product Expiration Date: 202503082359
Blood Product Expiration Date: 202503082359
Unit Type and Rh: 6200
Unit Type and Rh: 6200
Unit Type and Rh: 6200
Unit Type and Rh: 6200
Unit Type and Rh: 6200
Unit Type and Rh: 6200

## 2023-09-29 LAB — TYPE AND SCREEN
ABO/RH(D): A POS
Antibody Screen: NEGATIVE
PT AG Type: POSITIVE
Unit division: 0
Unit division: 0
Unit division: 0
Unit division: 0
Unit division: 0
Unit division: 0

## 2023-09-29 NOTE — Plan of Care (Signed)
  Problem: Clinical Measurements: Goal: Will remain free from infection Outcome: Progressing   Problem: Education: Goal: Knowledge of General Education information will improve Description: Including pain rating scale, medication(s)/side effects and non-pharmacologic comfort measures Outcome: Not Applicable   Problem: Health Behavior/Discharge Planning: Goal: Ability to manage health-related needs will improve Outcome: Not Applicable   Problem: Activity: Goal: Risk for activity intolerance will decrease Outcome: Not Applicable

## 2023-09-29 NOTE — Progress Notes (Signed)
   NAME:  Heather Padilla, MRN:  [id number], DOB:  05-30-1961, LOS: 6 ADMISSION DATE:  09/23/2023, CONSULTATION DATE:  09/23/2023 REFERRING MD:  Gillie - NSGY, CHIEF COMPLAINT: Subdural hematoma   History of Present Illness:  63 year old female with no documented past medical history who presented to the emergency department 2/2 evening as a level 1 trauma after a fall last night and found unresponsive this morning.  Husband states that this morning she seemed to be sleeping and when he checked on her at 4 in the afternoon she was unresponsive.  With EMS noted to have blown left pupil, possible decorticate posturing, hypertensive with systolics in the 200s.  Patient was intubated immediately upon arrival to the ED.  She was noted to have a large subdural hematoma over the left cerebral convexity with 1.7 cm of midline shift and evidence of herniation.  Labs notable for Na 132, K 3.4, bicarb 17, AG 17, WBC 20.7, i-STAT lactic 3.7, ABG with pH 7.33, pCO2 37, pO2 525.  She was evaluated by neurosurgery shortly after her arrival.  Given very poor clinical exam, did not feel that operative intervention was appropriate.  Patient was admitted to ICU.  CCM consulted for vent management.  Pertinent Medical History:  None documented  Significant Hospital Events: Including procedures, antibiotic start and stop dates in addition to other pertinent events   2/2 -  ED as a level 1 trauma, unresponsive, intubated, CT head with large subdural and uncal herniation.  Admitted to ICU. 2/5 -attempted DCD organ donation, but patient blinked her eyes while sedation was interrupted and organ procurement process was aborted.  Interim History / Subjective:   Exam remains poor.  Some increased work of breathing.   Objective:  Blood pressure 135/69, pulse (!) 127, temperature 98.8 F (37.1 C), temperature source Oral, resp. rate (!) 22, height 5' 7 (1.702 m), weight 63.8 kg, SpO2 (!) 36%.        Intake/Output  Summary (Last 24 hours) at 09/29/2023 1032 Last data filed at 09/29/2023 0500 Gross per 24 hour  Intake 350.06 ml  Output 350 ml  Net 0.06 ml   Filed Weights   09/26/23 0500 09/27/23 0500 09/29/23 0500  Weight: 62.1 kg 64.5 kg 63.8 kg   Physical Examination: General laying in bed appears comfortable HENT left periorbital ecchymosis MM dry Pulm not labored. Episodes of apnea  Card rrr Abd soft Neuro unresponsive   Ancillary tests personally reviewed:  No new investigations.  Assessment & Plan:  Acute left subdural hematoma with brain compression Uncal herniation  Acute hypoxic respiratory failure 2/2 above  Comfort care status DNR  Discussion S/p devastating neurological insult w/out chance for functional recovery. Transitioned to DNR and comfort on 2/7. Appears comfortable  Plan Cont comfort care

## 2023-09-29 NOTE — Plan of Care (Signed)
 Patient nonresponsive. Continuous morphine  drip. Foley intact.  Problem: Clinical Measurements: Goal: Ability to maintain clinical measurements within normal limits will improve Outcome: Progressing   Problem: Nutrition: Goal: Adequate nutrition will be maintained Outcome: Progressing   Problem: Coping: Goal: Level of anxiety will decrease Outcome: Progressing   Problem: Elimination: Goal: Will not experience complications related to bowel motility Outcome: Progressing Goal: Will not experience complications related to urinary retention Outcome: Progressing   Problem: Safety: Goal: Ability to remain free from injury will improve Outcome: Progressing   Problem: Skin Integrity: Goal: Risk for impaired skin integrity will decrease Outcome: Progressing   Problem: Skin Integrity: Goal: Risk for impaired skin integrity will decrease Outcome: Progressing

## 2023-09-30 LAB — CULTURE, BLOOD (ROUTINE X 2): Culture: NO GROWTH

## 2023-10-20 NOTE — Progress Notes (Signed)
 Spoke with Deward, patient's friend. I also spoke with [place], patient's sister, this morning around 0740 to inform them the patient had expired. Family unable to see the body before transferring the patient [facility].   Representative from [facility] came to tag and pick up the body around 1330.

## 2023-10-20 NOTE — Progress Notes (Signed)
 Pt time of death @0643 , confirmed by this nurse and Nellie, RN. Notified Dr. Dub, no response. Attempted to contact both Deward and [place], no answer, left vm. Notified [facility]. Contacted pt placement to advise that dayshift nurse will attempt to reach out to family again before transferring to morgue.

## 2023-10-20 NOTE — Discharge Summary (Signed)
 DEATH SUMMARY   Patient Details  Name: Heather Padilla MRN: [id number] DOB: 09-20-1960  Admission/Discharge Information   Admit Date:  2023/10/11  Date of Death: Date of Death: October 18, 2023  Time of Death: Time of Death: 0643  Length of Stay: 7  Referring Physician: Patient, No Pcp Per   Reason(s) for Hospitalization  Subdural hematoma  Diagnoses  Preliminary cause of death: Traumatic subdural hematoma Secondary Diagnoses (including complications and co-morbidities):  Principal Problem:   SDH (subdural hematoma) (HCC) Active Problems:   Acute subdural hematoma (HCC) Brain compression with herniation. Alcohol  abuse.  Brief Hospital Course (including significant findings, care, treatment, and services provided and events leading to death)  SERIYAH COLLISON is a 63 y.o. year old female who presented as a level 1 trauma after a fall.  She was found unresponsive the next day.  On arrival she was found to have a dilated left pupil with decorticate posturing and was severely hypertensive.  She was found to have a large subdural hematoma.  There was already signs of subuncal herniation.  Exam was poor on presentation.  Deemed not to be a surgical candidate. She did not immediately progress to brain death.  We reached out to family and informed them that although the patient would likely survive her injury that she would be left neurologically devastated.  Plan was then made to proceed to comfort care.  There was an unsuccessful attempt to proceed with organ donation after cardiac death.  Ultimately patient was transferred to palliative care floor and passed away comfortably.  Case was referred to the medical examiner.  Pertinent Labs and Studies  Significant Diagnostic Studies ECHOCARDIOGRAM COMPLETE Result Date: 09/26/2023    ECHOCARDIOGRAM REPORT   Patient Name:   CLORENE NERIO Date of Exam: 09/26/2023 Medical Rec #:  [id number]                   Height:        67.0 in Accession #:    [id number]                  Weight:       136.9 lb Date of Birth:  1961-08-05                   BSA:          1.721 m Patient Age:    63 years                    BP:           117/67 mmHg Patient Gender: F                           HR:           87 bpm. Exam Location:  Inpatient Procedure: 2D Echo, Cardiac Doppler, Color Doppler and Intracardiac            Opacification Agent Indications:    Organ Donor  History:        Patient has no prior history of Echocardiogram examinations.                 ETOH abuse.  Sonographer:    Tillman Nora RVT RCS Referring Phys: [id number] TORIBIO JAYSON SHARPS  Sonographer Comments: Suboptimal parasternal window, echo performed with patient supine and on artificial respirator and patient is obese. IMPRESSIONS  1. Poor acoustic windows.  2. Left ventricular ejection fraction,  by estimation, is 65 to 70%. The left ventricle has normal function. The left ventricle has no regional wall motion abnormalities. Left ventricular diastolic parameters were normal.  3. Right ventricular systolic function is normal. The right ventricular size is normal.  4. The mitral valve is normal in structure. Trivial mitral valve regurgitation.  5. The aortic valve is tricuspid. Aortic valve regurgitation is not visualized.  6. The inferior vena cava is normal in size with greater than 50% respiratory variability, suggesting right atrial pressure of 3 mmHg. FINDINGS  Left Ventricle: Left ventricular ejection fraction, by estimation, is 65 to 70%. The left ventricle has normal function. The left ventricle has no regional wall motion abnormalities. Definity  contrast agent was given IV to delineate the left ventricular  endocardial borders. The left ventricular internal cavity size was normal in size. There is no left ventricular hypertrophy. Left ventricular diastolic parameters were normal. Right Ventricle: The right ventricular size is normal. Right vetricular wall thickness was not assessed.  Right ventricular systolic function is normal. Left Atrium: Left atrial size was normal in size. Right Atrium: Right atrial size was normal in size. Pericardium: There is no evidence of pericardial effusion. Mitral Valve: The mitral valve is normal in structure. Trivial mitral valve regurgitation. Tricuspid Valve: The tricuspid valve is normal in structure. Tricuspid valve regurgitation is trivial. Aortic Valve: The aortic valve is tricuspid. Aortic valve regurgitation is not visualized. Aortic valve mean gradient measures 3.0 mmHg. Aortic valve peak gradient measures 5.2 mmHg. Aortic valve area, by VTI measures 1.99 cm. Pulmonic Valve: The pulmonic valve was normal in structure. Pulmonic valve regurgitation is not visualized. Aorta: The aortic root and ascending aorta are structurally normal, with no evidence of dilitation. Venous: The inferior vena cava is normal in size with greater than 50% respiratory variability, suggesting right atrial pressure of 3 mmHg. IAS/Shunts: No atrial level shunt detected by color flow Doppler.  LEFT VENTRICLE PLAX 2D LVIDd:         3.60 cm   Diastology LVIDs:         2.10 cm   LV e' medial:    7.62 cm/s LV PW:         1.00 cm   LV E/e' medial:  9.4 LV IVS:        1.00 cm   LV e' lateral:   8.38 cm/s LVOT diam:     1.70 cm   LV E/e' lateral: 8.6 LV SV:         42 LV SV Index:   24 LVOT Area:     2.27 cm  RIGHT VENTRICLE             IVC RV Basal diam:  3.40 cm     IVC diam: 1.00 cm RV S prime:     16.00 cm/s TAPSE (M-mode): 1.9 cm LEFT ATRIUM             Index        RIGHT ATRIUM          Index LA diam:        3.00 cm 1.74 cm/m   RA Area:     8.66 cm LA Vol (A2C):   19.1 ml 11.10 ml/m  RA Volume:   15.90 ml 9.24 ml/m LA Vol (A4C):   17.6 ml 10.22 ml/m LA Biplane Vol: 18.0 ml 10.46 ml/m  AORTIC VALVE  PULMONIC VALVE AV Area (Vmax):    1.99 cm     PV Vmax:       1.10 m/s AV Area (Vmean):   2.02 cm     PV Peak grad:  4.8 mmHg AV Area (VTI):     1.99 cm AV  Vmax:           114.00 cm/s AV Vmean:          75.000 cm/s AV VTI:            0.212 m AV Peak Grad:      5.2 mmHg AV Mean Grad:      3.0 mmHg LVOT Vmax:         100.00 cm/s LVOT Vmean:        66.800 cm/s LVOT VTI:          0.185 m LVOT/AV VTI ratio: 0.87  AORTA Ao Root diam: 3.30 cm Ao Asc diam:  2.60 cm MITRAL VALVE MV Area (PHT): 4.80 cm    SHUNTS MV Decel Time: 158 msec    Systemic VTI:  0.18 m MV E velocity: 71.90 cm/s  Systemic Diam: 1.70 cm MV A velocity: 88.80 cm/s MV E/A ratio:  0.81 Vina Gull MD Electronically signed by Vina Gull MD Signature Date/Time: 09/26/2023/11:47:35 AM    Final    CT CHEST ABDOMEN PELVIS W CONTRAST Addendum Date: 09/26/2023 ADDENDUM REPORT: 09/26/2023 10:53 ADDENDUM: The original report was by Dr. Dorethia Molt. The following addendum is by Dr. Ryan Salvage: I was contacted by telephone by organ procurement surfaces personnel in order to ascertain whether there were imaging signs of emphysema in the lungs. Mild centrilobular emphysema is present in the lungs. Electronically Signed   By: Ryan Salvage M.D.   On: 09/26/2023 10:53   Result Date: 09/26/2023 CLINICAL DATA:  Hematologic malignancy, assess treatment response. * Tracking Code: BO * EXAM: CT CHEST, ABDOMEN, AND PELVIS WITH CONTRAST TECHNIQUE: Multidetector CT imaging of the chest, abdomen and pelvis was performed following the standard protocol during bolus administration of intravenous contrast. RADIATION DOSE REDUCTION: This exam was performed according to the departmental dose-optimization program which includes automated exposure control, adjustment of the mA and/or kV according to patient size and/or use of iterative reconstruction technique. CONTRAST:  75mL OMNIPAQUE  IOHEXOL  350 MG/ML SOLN COMPARISON:  09/23/2023 a FINDINGS: CT CHEST FINDINGS Cardiovascular: No significant vascular findings. Normal heart size. No pericardial effusion. Mild atherosclerotic calcification within the thoracic aorta  Mediastinum/Nodes: Endotracheal tube and nasogastric tube in expected position. Visualized thyroid is unremarkable. No pathologic thoracic adenopathy. Esophagus unremarkable. Lungs/Pleura: There is focal peripheral consolidation within the left lower lobe and ground-glass infiltrate noted within the right lower lobe, similar prior examination. There is extensive airway impaction within the lower lobes bilaterally. Together, the findings are in keeping with aspiration. Trace bilateral pleural effusions. No pneumothorax. Musculoskeletal: No chest wall mass or suspicious bone lesions identified. CT ABDOMEN PELVIS FINDINGS Hepatobiliary: No focal liver abnormality is seen. No gallstones, gallbladder wall thickening, or biliary dilatation. Pancreas: Unremarkable Spleen: Normal in size without focal abnormality. Adrenals/Urinary Tract: Adrenal glands are unremarkable. Kidneys are normal, without renal calculi, focal lesion, or hydronephrosis. Bladder is decompressed with a Foley catheter balloon seen within its lumen. Stomach/Bowel: Mild descending and sigmoid colonic diverticulosis. Nasogastric tube tip seen within the distal body of the stomach. Stomach, small bowel, and large bowel are otherwise unremarkable there is no evidence of obstruction or focal inflammation. Appendix absent. No free intraperitoneal gas or fluid. Vascular/Lymphatic:  Aortic atherosclerosis. No enlarged abdominal or pelvic lymph nodes. Reproductive: Uterus and bilateral adnexa are unremarkable. Other: No abdominal wall hernia or abnormality. No abdominopelvic ascites. Musculoskeletal: No acute or significant osseous findings. IMPRESSION: 1. No evidence of recurrent or metastatic disease within the chest, abdomen, or pelvis. 2. Persistent bilateral lower lobe airspace consolidation and ground-glass infiltrate with extensive airway impaction within the lower lobes bilaterally. Together, the findings are in keeping with aspiration. 3. Trace bilateral  pleural effusions. 4. Mild distal colonic diverticulosis without superimposed acute inflammatory change. Aortic Atherosclerosis (ICD10-I70.0). Electronically Signed: By: Dorethia Molt M.D. On: 09/26/2023 00:26   DG Chest Port 1 View Result Date: 09/26/2023 CLINICAL DATA:  Acute respiratory failure. Respiratory distress. Respirator dependent. EXAM: PORTABLE CHEST 1 VIEW COMPARISON:  09/23/2023 FINDINGS: Endotracheal tube present with tip measuring 3.4 cm above the carina. Enteric tube is present. Tip is off the field of view but below the left hemidiaphragm. Heart size and pulmonary vascularity are normal. Emphysematous changes in the lungs. No airspace disease or consolidation. No pleural effusion. No pneumothorax. Mediastinal contours appear intact. Calcification of the aorta. IMPRESSION: Appliances appear in satisfactory position. Emphysematous changes in the lungs. No evidence of active pulmonary disease. Electronically Signed   By: Elsie Gravely M.D.   On: 09/26/2023 01:38   CT ANGIO HEAD NECK W WO CM Result Date: 09/26/2023 CLINICAL DATA:  Initial evaluation for hematologic malignancy, assess treatment response. EXAM: CT ANGIOGRAPHY HEAD AND NECK WITH AND WITHOUT CONTRAST TECHNIQUE: Multidetector CT imaging of the head and neck was performed using the standard protocol during bolus administration of intravenous contrast. Multiplanar CT image reconstructions and MIPs were obtained to evaluate the vascular anatomy. Carotid stenosis measurements (when applicable) are obtained utilizing NASCET criteria, using the distal internal carotid diameter as the denominator. RADIATION DOSE REDUCTION: This exam was performed according to the departmental dose-optimization program which includes automated exposure control, adjustment of the mA and/or kV according to patient size and/or use of iterative reconstruction technique. CONTRAST:  75mL OMNIPAQUE  IOHEXOL  350 MG/ML SOLN COMPARISON:  CT from 09/23/2023. FINDINGS:  CT HEAD FINDINGS Brain: Acute holo hemispheric subdural hematoma overlying the left cerebral convexity again seen. Hemorrhage measures up to 2.1 cm, previously 2.5 cm when measured in a similar fashion. Persistent mass effect on the subjacent left cerebral hemisphere with 1.5 cm of left-to-right shift, previously 1.8 cm. Mild asymmetric dilatation of the right lateral ventricle, similar to prior. No overt hydrocephalus. Basilar cisterns are mildly crowded but remain patent. Hypodensity seen involving the mesial left temporal lobe/left hippocampal formation, likely related to mass effect and uncal herniation (series 9, image 35). No other acute large vessel territory infarct. No other new acute intracranial hemorrhage. No mass lesion. No abnormal hyperdense vessel. Vascular: No abnormal hyperdense vessel. Skull: Scalp soft tissues demonstrate no acute finding. Calvarium intact. Sinuses/Orbits: Globes and orbital soft tissues within normal limits. Scattered mucosal thickening present about the sphenoethmoidal sinuses. Small left mastoid effusion. Patient is intubated. Other: None. Review of the MIP images confirms the above findings CTA NECK FINDINGS Aortic arch: Visualized arch within normal limits for caliber with standard 3 vessel morphology. Mild aortic atherosclerosis. No stenosis about the origin the great vessels. Right carotid system: Right common and internal carotid arteries are patent without dissection. Mild atheromatous change about the right carotid bulb without hemodynamically significant stenosis. Left carotid system: Left common and internal carotid arteries are patent without dissection. No hemodynamically significant stenosis about the left carotid artery system. Vertebral arteries: Both vertebral arteries  arise from subclavian arteries. Vertebral arteries are patent without stenosis or dissection. Skeleton: No worrisome osseous lesions. Other neck: No other acute finding. Endotracheal and enteric  tubes in place. No visible mass or adenopathy. Upper chest: Small layering left pleural effusion with associated atelectasis. Review of the MIP images confirms the above findings CTA HEAD FINDINGS Anterior circulation: Both internal carotid arteries are patent through the siphons without stenosis or other abnormality. A1 segments patent bilaterally. Normal anterior communicating artery complex. Anterior cerebral arteries patent without stenosis. No M1 stenosis or occlusion. No proximal MCA branch occlusion or visible high-grade stenosis. Distal MCA branches perfused and fairly symmetric. Mass effect on the left MCA branches due to the left sided subdural hematoma. Posterior circulation: Both V4 segments patent without stenosis. Both PICA patent. Basilar diminutive but patent without stenosis. Superior cerebral arteries patent bilaterally. Predominant fetal type origin of the PCAs bilaterally. Focal moderate to severe stenosis involving the right PCOM noted (series 14, image 169). PCAs otherwise patent to their distal aspects without visible stenosis. Venous sinuses: Patent allowing for timing the contrast bolus. Anatomic variants: As above. No visible aneurysm or other vascular malformation. Review of the MIP images confirms the above findings IMPRESSION: CT HEAD: 1. Slight interval decrease in size of acute holo hemispheric subdural hematoma overlying the left cerebral convexity, measuring up to 2.1 cm, previously 2.5 cm. Persistent mass effect on the subjacent left cerebral hemisphere with 1.5 cm of left-to-right shift, previously 1.8 cm. 2. Hypodensity involving the mesial left temporal lobe/left hippocampal formation, consistent with injury related to uncal herniation. 3. No other new acute intracranial abnormality. CTA HEAD AND NECK: 1. Negative CTA for large vessel occlusion or other emergent finding. 2. Fetal type origin of the PCAs with focal moderate to severe stenosis involving the right PCOM. 3. Small  layering left pleural effusion with associated atelectasis. 4.  Aortic Atherosclerosis (ICD10-I70.0). Electronically Signed   By: Morene Hoard M.D.   On: 09/26/2023 00:43   DG Abd Portable 1V Result Date: 09/24/2023 CLINICAL DATA:  OG tube placement EXAM: PORTABLE ABDOMEN - 1 VIEW COMPARISON:  None Available. FINDINGS: Esophagogastric tube with tip and side port below the diaphragm, tip near the gastric antrum. No free air. IMPRESSION: Esophagogastric tube with tip and side port below the diaphragm, tip near the gastric antrum. No free air. Electronically Signed   By: Marolyn JONETTA Jaksch M.D.   On: 09/24/2023 13:11   CT HEAD WO CONTRAST Result Date: 09/23/2023 CLINICAL DATA:  Level 1 trauma.  Fall with posturing. EXAM: CT HEAD WITHOUT CONTRAST CT MAXILLOFACIAL WITHOUT CONTRAST CT CERVICAL SPINE WITHOUT CONTRAST TECHNIQUE: Multidetector CT imaging of the head, cervical spine, and maxillofacial structures were performed using the standard protocol without intravenous contrast. Multiplanar CT image reconstructions of the cervical spine and maxillofacial structures were also generated. RADIATION DOSE REDUCTION: This exam was performed according to the departmental dose-optimization program which includes automated exposure control, adjustment of the mA and/or kV according to patient size and/or use of iterative reconstruction technique. COMPARISON:  Head CT 03/11/2020 FINDINGS: CT HEAD FINDINGS Brain: A large acute subdural hematoma extending diffusely over the left cerebral convexity measures up to 2.5 cm in thickness. There is prominent mass effect on the underlying left cerebral hemisphere with partial effacement of the left lateral and third ventricles and 1.7 cm of rightward midline shift. Slight prominence of the right temporal horn could reflect early trapping. There is left uncal herniation and partial effacement of the basilar cisterns. A small amount of  subdural hemorrhage is noted along the falx. No  acute infarct is identified. Vascular: No hyperdense vessel. Skull: No acute fracture or suspicious osseous lesion. Other: None. CT MAXILLOFACIAL FINDINGS Osseous: No acute fracture, mandibular dislocation, or destructive process. Orbits: Bilateral cataract extraction. Sinuses: The paranasal sinuses and mastoid air cells are largely clear. Soft tissues: Hematoma in the left lateral face and periorbital tissues. CT CERVICAL SPINE FINDINGS Alignment: Trace anterolisthesis of C5 on C6. Skull base and vertebrae: No acute fracture or suspicious osseous lesion. Soft tissues and spinal canal: No prevertebral fluid or swelling. No visible canal hematoma. Disc levels: Minor cervical spondylosis. Facet arthrosis predominantly at C5-6 where it is severe on the right. Upper chest: Reported separately on the dedicated chest CT. Other: Partially visualized endotracheal and enteric tubes. Critical Value/emergent results were called by telephone at the time of interpretation on 09/23/2023 at 6:19 pm to Dr. Stevie, who verbally acknowledged these results. IMPRESSION: 1. Large acute subdural hematoma over the left cerebral convexity with mass effect and herniation as described above including 1.7 cm of rightward midline shift. 2. No acute skull, maxillofacial, or cervical spine fracture. Electronically Signed   By: Dasie Hamburg M.D.   On: 09/23/2023 18:33   CT CERVICAL SPINE WO CONTRAST Result Date: 09/23/2023 CLINICAL DATA:  Level 1 trauma.  Fall with posturing. EXAM: CT HEAD WITHOUT CONTRAST CT MAXILLOFACIAL WITHOUT CONTRAST CT CERVICAL SPINE WITHOUT CONTRAST TECHNIQUE: Multidetector CT imaging of the head, cervical spine, and maxillofacial structures were performed using the standard protocol without intravenous contrast. Multiplanar CT image reconstructions of the cervical spine and maxillofacial structures were also generated. RADIATION DOSE REDUCTION: This exam was performed according to the departmental dose-optimization  program which includes automated exposure control, adjustment of the mA and/or kV according to patient size and/or use of iterative reconstruction technique. COMPARISON:  Head CT 03/11/2020 FINDINGS: CT HEAD FINDINGS Brain: A large acute subdural hematoma extending diffusely over the left cerebral convexity measures up to 2.5 cm in thickness. There is prominent mass effect on the underlying left cerebral hemisphere with partial effacement of the left lateral and third ventricles and 1.7 cm of rightward midline shift. Slight prominence of the right temporal horn could reflect early trapping. There is left uncal herniation and partial effacement of the basilar cisterns. A small amount of subdural hemorrhage is noted along the falx. No acute infarct is identified. Vascular: No hyperdense vessel. Skull: No acute fracture or suspicious osseous lesion. Other: None. CT MAXILLOFACIAL FINDINGS Osseous: No acute fracture, mandibular dislocation, or destructive process. Orbits: Bilateral cataract extraction. Sinuses: The paranasal sinuses and mastoid air cells are largely clear. Soft tissues: Hematoma in the left lateral face and periorbital tissues. CT CERVICAL SPINE FINDINGS Alignment: Trace anterolisthesis of C5 on C6. Skull base and vertebrae: No acute fracture or suspicious osseous lesion. Soft tissues and spinal canal: No prevertebral fluid or swelling. No visible canal hematoma. Disc levels: Minor cervical spondylosis. Facet arthrosis predominantly at C5-6 where it is severe on the right. Upper chest: Reported separately on the dedicated chest CT. Other: Partially visualized endotracheal and enteric tubes. Critical Value/emergent results were called by telephone at the time of interpretation on 09/23/2023 at 6:19 pm to Dr. Stevie, who verbally acknowledged these results. IMPRESSION: 1. Large acute subdural hematoma over the left cerebral convexity with mass effect and herniation as described above including 1.7 cm of  rightward midline shift. 2. No acute skull, maxillofacial, or cervical spine fracture. Electronically Signed   By: Dasie Hamburg HERO.D.  On: 09/23/2023 18:33   CT Maxillofacial Wo Contrast Result Date: 09/23/2023 CLINICAL DATA:  Level 1 trauma.  Fall with posturing. EXAM: CT HEAD WITHOUT CONTRAST CT MAXILLOFACIAL WITHOUT CONTRAST CT CERVICAL SPINE WITHOUT CONTRAST TECHNIQUE: Multidetector CT imaging of the head, cervical spine, and maxillofacial structures were performed using the standard protocol without intravenous contrast. Multiplanar CT image reconstructions of the cervical spine and maxillofacial structures were also generated. RADIATION DOSE REDUCTION: This exam was performed according to the departmental dose-optimization program which includes automated exposure control, adjustment of the mA and/or kV according to patient size and/or use of iterative reconstruction technique. COMPARISON:  Head CT 03/11/2020 FINDINGS: CT HEAD FINDINGS Brain: A large acute subdural hematoma extending diffusely over the left cerebral convexity measures up to 2.5 cm in thickness. There is prominent mass effect on the underlying left cerebral hemisphere with partial effacement of the left lateral and third ventricles and 1.7 cm of rightward midline shift. Slight prominence of the right temporal horn could reflect early trapping. There is left uncal herniation and partial effacement of the basilar cisterns. A small amount of subdural hemorrhage is noted along the falx. No acute infarct is identified. Vascular: No hyperdense vessel. Skull: No acute fracture or suspicious osseous lesion. Other: None. CT MAXILLOFACIAL FINDINGS Osseous: No acute fracture, mandibular dislocation, or destructive process. Orbits: Bilateral cataract extraction. Sinuses: The paranasal sinuses and mastoid air cells are largely clear. Soft tissues: Hematoma in the left lateral face and periorbital tissues. CT CERVICAL SPINE FINDINGS Alignment: Trace  anterolisthesis of C5 on C6. Skull base and vertebrae: No acute fracture or suspicious osseous lesion. Soft tissues and spinal canal: No prevertebral fluid or swelling. No visible canal hematoma. Disc levels: Minor cervical spondylosis. Facet arthrosis predominantly at C5-6 where it is severe on the right. Upper chest: Reported separately on the dedicated chest CT. Other: Partially visualized endotracheal and enteric tubes. Critical Value/emergent results were called by telephone at the time of interpretation on 09/23/2023 at 6:19 pm to Dr. Stevie, who verbally acknowledged these results. IMPRESSION: 1. Large acute subdural hematoma over the left cerebral convexity with mass effect and herniation as described above including 1.7 cm of rightward midline shift. 2. No acute skull, maxillofacial, or cervical spine fracture. Electronically Signed   By: Dasie Hamburg M.D.   On: 09/23/2023 18:33   CT CHEST ABDOMEN PELVIS W CONTRAST Result Date: 09/23/2023 CLINICAL DATA:  Poly trauma, blunt. Patient was found face down in driveway around 10 p.m. last night. History of substance use. Now found with agonal breathing and unresponsive. EXAM: CT CHEST, ABDOMEN, AND PELVIS WITH CONTRAST TECHNIQUE: Multidetector CT imaging of the chest, abdomen and pelvis was performed following the standard protocol during bolus administration of intravenous contrast. RADIATION DOSE REDUCTION: This exam was performed according to the departmental dose-optimization program which includes automated exposure control, adjustment of the mA and/or kV according to patient size and/or use of iterative reconstruction technique. COMPARISON:  Chest radiograph 09/23/2023. CT chest 05/31/2017. CT abdomen 03/07/2007. CT abdomen and pelvis 09/05/2005 FINDINGS: CT CHEST FINDINGS Cardiovascular: Normal heart size. No pericardial effusions. Normal caliber thoracic aorta. Calcification of the aorta. No dissection. Great vessel origins are patent.  Mediastinum/Nodes: An enteric tube is present with tip in the stomach. An endotracheal tube is present with tip above the carina. Thyroid gland is unremarkable. Esophagus is decompressed. No significant lymphadenopathy. No mediastinal hematoma. Lungs/Pleura: Atelectasis or consolidation in the lung bases, greater on the left. No pneumothorax. No pleural effusions. Musculoskeletal: Old bilateral rib fractures.  Degenerative changes in the spine. Postoperative changes in the left humerus. CT ABDOMEN PELVIS FINDINGS Hepatobiliary: Mild diffuse fatty infiltration of the liver. No laceration or hematoma. Gallbladder and bile ducts are normal. Pancreas: Unremarkable. No pancreatic ductal dilatation or surrounding inflammatory changes. Spleen: No splenic injury or perisplenic hematoma. Adrenals/Urinary Tract: No adrenal hemorrhage or renal injury identified. Bladder is unremarkable. Stomach/Bowel: Stomach, small bowel, and colon are not abnormally distended. No wall thickening or inflammatory changes. Scattered colonic diverticula without evidence of acute diverticulitis. Appendix is not identified. Vascular/Lymphatic: Aortic atherosclerosis. No enlarged abdominal or pelvic lymph nodes. Reproductive: Uterus and bilateral adnexa are unremarkable. Other: No abdominal wall hernia or abnormality. No abdominopelvic ascites. Musculoskeletal: Degenerative changes in the spine. No acute bony abnormalities. IMPRESSION: 1. No acute posttraumatic changes demonstrated in the chest, abdomen, or pelvis. 2. Atelectasis or consolidation in the lung bases. 3. Endotracheal tube and enteric tube appear in satisfactory position. 4. Fatty infiltration of the liver. 5. Aortic atherosclerosis. Critical Value/emergent results were called by telephone at the time of interpretation on 09/23/2023 at 6:15 pm to provider HERLENE BUREAU , who verbally acknowledged these results. Electronically Signed   By: Elsie Gravely M.D.   On: 09/23/2023 18:21    DG Chest Port 1 View Result Date: 09/23/2023 CLINICAL DATA:  Trauma. Patient was found face down in driveway around 10 p.m. last night. History of substance abuse. Now found with agonal breathing and unresponsive. EXAM: PORTABLE CHEST 1 VIEW COMPARISON:  09/25/2013 FINDINGS: Endotracheal tube present with tip measuring 2.7 cm above the carina. Enteric tube is present. Tip is off the field of view but below the left hemidiaphragm. Heart size and pulmonary vascularity are normal. Lungs are clear. No pleural effusion or pneumothorax. Old appearing bilateral rib fractures. Postoperative changes in the left humerus. Calcification of the aorta. IMPRESSION: Appliances appear in satisfactory position. Lungs are clear. Old rib fractures. Electronically Signed   By: Elsie Gravely M.D.   On: 09/23/2023 18:08    Microbiology No results found for this or any previous visit (from the past 240 hours).  Lab Basic Metabolic Panel: No results for input(s): NA, K, CL, CO2, GLUCOSE, BUN, CREATININE, CALCIUM, MG, PHOS in the last 168 hours. Liver Function Tests: No results for input(s): AST, ALT, ALKPHOS, BILITOT, PROT, ALBUMIN in the last 168 hours. No results for input(s): LIPASE, AMYLASE in the last 168 hours. No results for input(s): AMMONIA in the last 168 hours. CBC: No results for input(s): WBC, NEUTROABS, HGB, HCT, MCV, PLT in the last 168 hours. Cardiac Enzymes: No results for input(s): CKTOTAL, CKMB, CKMBINDEX, TROPONINI in the last 168 hours. Sepsis Labs: No results for input(s): PROCALCITON, WBC, LATICACIDVEN in the last 168 hours.  Procedures/Operations  Mechanical ventilation.   Cotey Rakes 10/11/2023, 12:15 PM

## 2023-10-20 DEATH — deceased
